# Patient Record
Sex: Female | Born: 1935 | Race: White | Hispanic: No | Marital: Single | Smoking: Former smoker
Health system: Southern US, Community
[De-identification: ages and names within clinical notes are randomized; demographics above are authoritative.]

## PROBLEM LIST (undated history)

## (undated) DIAGNOSIS — M545 Low back pain, unspecified: Secondary | ICD-10-CM

## (undated) DIAGNOSIS — N183 Chronic kidney disease, stage 3 unspecified: Secondary | ICD-10-CM

## (undated) DIAGNOSIS — I509 Heart failure, unspecified: Secondary | ICD-10-CM

## (undated) DIAGNOSIS — E079 Disorder of thyroid, unspecified: Secondary | ICD-10-CM

## (undated) DIAGNOSIS — F1011 Alcohol abuse, in remission: Secondary | ICD-10-CM

## (undated) DIAGNOSIS — R1310 Dysphagia, unspecified: Secondary | ICD-10-CM

## (undated) DIAGNOSIS — K56609 Unspecified intestinal obstruction, unspecified as to partial versus complete obstruction: Secondary | ICD-10-CM

## (undated) DIAGNOSIS — I10 Essential (primary) hypertension: Secondary | ICD-10-CM

## (undated) DIAGNOSIS — M6281 Muscle weakness (generalized): Secondary | ICD-10-CM

## (undated) DIAGNOSIS — E039 Hypothyroidism, unspecified: Secondary | ICD-10-CM

## (undated) DIAGNOSIS — F1121 Opioid dependence, in remission: Secondary | ICD-10-CM

## (undated) DIAGNOSIS — G8929 Other chronic pain: Secondary | ICD-10-CM

## (undated) DIAGNOSIS — I4891 Unspecified atrial fibrillation: Secondary | ICD-10-CM

## (undated) DIAGNOSIS — W19XXXA Unspecified fall, initial encounter: Secondary | ICD-10-CM

## (undated) HISTORY — PX: ABDOMINAL HYSTERECTOMY: SHX81

## (undated) HISTORY — PX: BACK SURGERY: SHX140

## (undated) HISTORY — PX: ROTATOR CUFF REPAIR: SHX139

---

## 1998-02-13 ENCOUNTER — Other Ambulatory Visit: Admission: RE | Admit: 1998-02-13 | Discharge: 1998-02-13 | Payer: Self-pay

## 1998-02-14 ENCOUNTER — Other Ambulatory Visit: Admission: RE | Admit: 1998-02-14 | Discharge: 1998-02-14 | Payer: Self-pay

## 1998-02-19 ENCOUNTER — Other Ambulatory Visit: Admission: RE | Admit: 1998-02-19 | Discharge: 1998-02-19 | Payer: Self-pay

## 2004-04-17 ENCOUNTER — Emergency Department (HOSPITAL_COMMUNITY): Admission: EM | Admit: 2004-04-17 | Discharge: 2004-04-17 | Payer: Self-pay | Admitting: Emergency Medicine

## 2006-08-18 ENCOUNTER — Encounter (HOSPITAL_BASED_OUTPATIENT_CLINIC_OR_DEPARTMENT_OTHER): Admission: RE | Admit: 2006-08-18 | Discharge: 2006-09-20 | Payer: Self-pay | Admitting: Surgery

## 2006-09-24 ENCOUNTER — Encounter (HOSPITAL_BASED_OUTPATIENT_CLINIC_OR_DEPARTMENT_OTHER): Admission: RE | Admit: 2006-09-24 | Discharge: 2006-12-23 | Payer: Self-pay | Admitting: Surgery

## 2006-10-10 ENCOUNTER — Inpatient Hospital Stay (HOSPITAL_COMMUNITY): Admission: EM | Admit: 2006-10-10 | Discharge: 2006-10-14 | Payer: Self-pay | Admitting: Emergency Medicine

## 2007-07-12 ENCOUNTER — Ambulatory Visit (HOSPITAL_COMMUNITY): Admission: RE | Admit: 2007-07-12 | Discharge: 2007-07-12 | Payer: Self-pay | Admitting: Family Medicine

## 2008-07-30 ENCOUNTER — Ambulatory Visit (HOSPITAL_COMMUNITY): Admission: RE | Admit: 2008-07-30 | Discharge: 2008-07-30 | Payer: Self-pay | Admitting: Family Medicine

## 2008-08-14 ENCOUNTER — Ambulatory Visit (HOSPITAL_BASED_OUTPATIENT_CLINIC_OR_DEPARTMENT_OTHER): Admission: RE | Admit: 2008-08-14 | Discharge: 2008-08-14 | Payer: Self-pay | Admitting: Urology

## 2009-04-01 ENCOUNTER — Encounter (HOSPITAL_BASED_OUTPATIENT_CLINIC_OR_DEPARTMENT_OTHER): Admission: RE | Admit: 2009-04-01 | Discharge: 2009-04-23 | Payer: Self-pay | Admitting: General Surgery

## 2009-07-09 ENCOUNTER — Encounter: Admission: RE | Admit: 2009-07-09 | Discharge: 2009-07-09 | Payer: Self-pay | Admitting: Orthopedic Surgery

## 2009-07-31 ENCOUNTER — Ambulatory Visit (HOSPITAL_COMMUNITY): Admission: RE | Admit: 2009-07-31 | Discharge: 2009-07-31 | Payer: Self-pay | Admitting: Family Medicine

## 2009-11-07 ENCOUNTER — Emergency Department (HOSPITAL_COMMUNITY): Admission: EM | Admit: 2009-11-07 | Discharge: 2009-11-07 | Payer: Self-pay | Admitting: Emergency Medicine

## 2010-09-23 ENCOUNTER — Ambulatory Visit (HOSPITAL_COMMUNITY)
Admission: RE | Admit: 2010-09-23 | Discharge: 2010-09-23 | Payer: Self-pay | Source: Home / Self Care | Attending: Family Medicine | Admitting: Family Medicine

## 2010-09-28 ENCOUNTER — Inpatient Hospital Stay (HOSPITAL_COMMUNITY)
Admission: EM | Admit: 2010-09-28 | Discharge: 2010-09-30 | Payer: Self-pay | Source: Home / Self Care | Attending: Internal Medicine | Admitting: Internal Medicine

## 2010-10-06 LAB — COMPREHENSIVE METABOLIC PANEL
ALT: 14 U/L (ref 0–35)
AST: 22 U/L (ref 0–37)
Albumin: 2.6 g/dL — ABNORMAL LOW (ref 3.5–5.2)
Alkaline Phosphatase: 49 U/L (ref 39–117)
BUN: 21 mg/dL (ref 6–23)
CO2: 28 mEq/L (ref 19–32)
Calcium: 8.4 mg/dL (ref 8.4–10.5)
Chloride: 104 mEq/L (ref 96–112)
Creatinine, Ser: 1.31 mg/dL — ABNORMAL HIGH (ref 0.4–1.2)
GFR calc Af Amer: 47 mL/min — ABNORMAL LOW (ref >60)
GFR calc non Af Amer: 39 mL/min — ABNORMAL LOW (ref >60)
Glucose, Bld: 95 mg/dL (ref 70–99)
Potassium: 4.3 mEq/L (ref 3.5–5.1)
Sodium: 139 mEq/L (ref 135–145)
Total Bilirubin: 0.4 mg/dL (ref 0.3–1.2)
Total Protein: 5.3 g/dL — ABNORMAL LOW (ref 6.0–8.3)

## 2010-10-06 LAB — URINE MICROSCOPIC-ADD ON

## 2010-10-06 LAB — CBC
HCT: 33.4 % — ABNORMAL LOW (ref 36.0–46.0)
HCT: 31.7 % — ABNORMAL LOW (ref 36.0–46.0)
Hemoglobin: 10.9 g/dL — ABNORMAL LOW (ref 12.0–15.0)
Hemoglobin: 10.3 g/dL — ABNORMAL LOW (ref 12.0–15.0)
MCH: 31.6 pg (ref 26.0–34.0)
MCH: 32 pg (ref 26.0–34.0)
MCHC: 32.6 g/dL (ref 30.0–36.0)
MCHC: 32.5 g/dL (ref 30.0–36.0)
MCV: 96.8 fL (ref 78.0–100.0)
MCV: 98.4 fL (ref 78.0–100.0)
Platelets: 192 10*3/uL (ref 150–400)
Platelets: 173 10*3/uL (ref 150–400)
RBC: 3.45 MIL/uL — ABNORMAL LOW (ref 3.87–5.11)
RBC: 3.22 MIL/uL — ABNORMAL LOW (ref 3.87–5.11)
RDW: 13.5 % (ref 11.5–15.5)
RDW: 13.9 % (ref 11.5–15.5)
WBC: 10.7 10*3/uL — ABNORMAL HIGH (ref 4.0–10.5)
WBC: 8.5 10*3/uL (ref 4.0–10.5)

## 2010-10-06 LAB — URINALYSIS, ROUTINE W REFLEX MICROSCOPIC
Bilirubin Urine: NEGATIVE
Ketones, ur: NEGATIVE mg/dL
Nitrite: POSITIVE — AB
Protein, ur: NEGATIVE mg/dL
Specific Gravity, Urine: 1.019 (ref 1.005–1.030)
Urine Glucose, Fasting: NEGATIVE mg/dL
Urobilinogen, UA: 0.2 mg/dL (ref 0.0–1.0)
pH: 6.5 (ref 5.0–8.0)

## 2010-10-06 LAB — DIFFERENTIAL
Basophils Absolute: 0.1 10*3/uL (ref 0.0–0.1)
Basophils Relative: 1 % (ref 0–1)
Eosinophils Absolute: 0.1 10*3/uL (ref 0.0–0.7)
Eosinophils Relative: 1 % (ref 0–5)
Lymphocytes Relative: 19 % (ref 12–46)
Lymphs Abs: 2 10*3/uL (ref 0.7–4.0)
Monocytes Absolute: 0.8 10*3/uL (ref 0.1–1.0)
Monocytes Relative: 8 % (ref 3–12)
Neutro Abs: 7.8 10*3/uL — ABNORMAL HIGH (ref 1.7–7.7)
Neutrophils Relative %: 72 % (ref 43–77)

## 2010-10-06 LAB — TSH: TSH: 0.426 u[IU]/mL (ref 0.350–4.500)

## 2010-10-06 LAB — POCT CARDIAC MARKERS
CKMB, poc: <1 ng/mL — ABNORMAL LOW (ref 1.0–8.0)
Myoglobin, poc: 78.7 ng/mL (ref 12–200)
Troponin i, poc: <0.05 ng/mL (ref 0.00–0.09)

## 2010-10-06 LAB — URINE CULTURE
Colony Count: 80000
Culture  Setup Time: 201201082347

## 2010-10-06 LAB — BASIC METABOLIC PANEL
BUN: 29 mg/dL — ABNORMAL HIGH (ref 6–23)
CO2: 28 mEq/L (ref 19–32)
Calcium: 8.7 mg/dL (ref 8.4–10.5)
Chloride: 101 mEq/L (ref 96–112)
Creatinine, Ser: 1.53 mg/dL — ABNORMAL HIGH (ref 0.4–1.2)
GFR calc Af Amer: 39 mL/min — ABNORMAL LOW (ref >60)
GFR calc non Af Amer: 32 mL/min — ABNORMAL LOW (ref >60)
Glucose, Bld: 121 mg/dL — ABNORMAL HIGH (ref 70–99)
Potassium: 4.8 mEq/L (ref 3.5–5.1)
Sodium: 136 mEq/L (ref 135–145)

## 2010-10-06 LAB — PROTIME-INR
INR: 1.19 (ref 0.00–1.49)
Prothrombin Time: 15.3 seconds — ABNORMAL HIGH (ref 11.6–15.2)

## 2010-10-06 LAB — APTT: aPTT: 29 seconds (ref 24–37)

## 2010-10-06 LAB — GLUCOSE, CAPILLARY: Glucose-Capillary: 119 mg/dL — ABNORMAL HIGH (ref 70–99)

## 2010-10-12 ENCOUNTER — Encounter: Payer: Self-pay | Admitting: Family Medicine

## 2010-11-03 ENCOUNTER — Ambulatory Visit
Admission: RE | Admit: 2010-11-03 | Discharge: 2010-11-03 | Disposition: A | Discharge status: Home / Self Care | Payer: Medicare Other | Source: Ambulatory Visit | Attending: Family Medicine | Admitting: Family Medicine

## 2010-11-03 ENCOUNTER — Other Ambulatory Visit: Payer: Self-pay | Admitting: Family Medicine

## 2010-11-03 DIAGNOSIS — L539 Erythematous condition, unspecified: Secondary | ICD-10-CM

## 2010-11-03 DIAGNOSIS — R609 Edema, unspecified: Secondary | ICD-10-CM

## 2010-12-10 LAB — URINALYSIS, ROUTINE W REFLEX MICROSCOPIC
Bilirubin Urine: NEGATIVE
Glucose, UA: NEGATIVE mg/dL
Hgb urine dipstick: NEGATIVE
Ketones, ur: NEGATIVE mg/dL
Nitrite: NEGATIVE
Protein, ur: NEGATIVE mg/dL
Specific Gravity, Urine: 1.02 (ref 1.005–1.030)
Urobilinogen, UA: 0.2 mg/dL (ref 0.0–1.0)
pH: 5.5 (ref 5.0–8.0)

## 2010-12-10 LAB — URINE CULTURE
Colony Count: NO GROWTH
Culture: NO GROWTH

## 2010-12-10 LAB — OVA AND PARASITE EXAMINATION: Ova and parasites: NONE SEEN

## 2010-12-10 LAB — CBC
HCT: 30.5 % — ABNORMAL LOW (ref 36.0–46.0)
Hemoglobin: 10.6 g/dL — ABNORMAL LOW (ref 12.0–15.0)
MCHC: 34.6 g/dL (ref 30.0–36.0)
MCV: 94.6 fL (ref 78.0–100.0)
Platelets: 185 K/uL (ref 150–400)
RBC: 3.22 MIL/uL — ABNORMAL LOW (ref 3.87–5.11)
RDW: 13.6 % (ref 11.5–15.5)
WBC: 5.8 10*3/uL (ref 4.0–10.5)

## 2010-12-10 LAB — COMPREHENSIVE METABOLIC PANEL
ALT: 14 U/L (ref 0–35)
AST: 24 U/L (ref 0–37)
Alkaline Phosphatase: 52 U/L (ref 39–117)
CO2: 23 mEq/L (ref 19–32)
Calcium: 9.1 mg/dL (ref 8.4–10.5)
Chloride: 104 mEq/L (ref 96–112)
GFR calc Af Amer: 29 mL/min — ABNORMAL LOW (ref >60)
GFR calc non Af Amer: 24 mL/min — ABNORMAL LOW (ref >60)
Glucose, Bld: 99 mg/dL (ref 70–99)
Potassium: 5 mEq/L (ref 3.5–5.1)
Sodium: 135 mEq/L (ref 135–145)

## 2010-12-10 LAB — COMPREHENSIVE METABOLIC PANEL WITH GFR
Albumin: 4 g/dL (ref 3.5–5.2)
BUN: 38 mg/dL — ABNORMAL HIGH (ref 6–23)
Creatinine, Ser: 2.01 mg/dL — ABNORMAL HIGH (ref 0.4–1.2)
Total Bilirubin: 0.8 mg/dL (ref 0.3–1.2)
Total Protein: 7 g/dL (ref 6.0–8.3)

## 2010-12-10 LAB — DIFFERENTIAL
Basophils Absolute: 0 K/uL (ref 0.0–0.1)
Basophils Relative: 0 % (ref 0–1)
Eosinophils Absolute: 0.1 10*3/uL (ref 0.0–0.7)
Eosinophils Relative: 2 % (ref 0–5)
Lymphocytes Relative: 25 % (ref 12–46)
Lymphs Abs: 1.4 10*3/uL (ref 0.7–4.0)
Monocytes Absolute: 0.7 K/uL (ref 0.1–1.0)
Monocytes Relative: 12 % (ref 3–12)
Neutro Abs: 3.4 K/uL (ref 1.7–7.7)
Neutrophils Relative %: 60 % (ref 43–77)

## 2010-12-10 LAB — CLOSTRIDIUM DIFFICILE EIA: C difficile Toxins A+B, EIA: NEGATIVE

## 2010-12-10 LAB — STOOL CULTURE

## 2010-12-10 LAB — URINE MICROSCOPIC-ADD ON

## 2010-12-10 LAB — HEMOCCULT GUIAC POC 1CARD (OFFICE): Fecal Occult Bld: NEGATIVE

## 2011-02-03 NOTE — Op Note (Signed)
[name], [name]               ACCOUNT NO.:  [id number]   MEDICAL RECORD NO.:  [id number]          PATIENT TYPE:  AMB   LOCATION:  [facility]                         FACILITY:  [facility]   PHYSICIAN:  Martina Sinner, MD DATE OF BIRTH:  [birth year]   DATE OF PROCEDURE:  DATE OF DISCHARGE:                               OPERATIVE REPORT   PREOPERATIVE DIAGNOSES:  1. Neurogenic bladder.  2. Muscle spasm.  3. Refractory urge incontinence.   POSTOPERATIVE DIAGNOSES:  1. Neurogenic bladder.  2. Muscle spasm.  3. Refractory urge incontinence.   SURGERY:  1. Cystoscopy.  2. Hydrodistention.  3. Injection of Botox.   Christina [name] has refractory urge incontinence.  She was refractory to  medication.  Her neurogenic bladder was elected to be treated with  Botox.  The patient was prepped and draped in the usual fashion.  She  was given preoperative antibiotics.  The [facility] microscope was utilized.  She had grade 2-4 bladder trabeculation, which for a woman was  impressive.  Otherwise, her bladder mucosa and trigone were normal.  She  is hydrodistended at 600 ml.  She has no glomerulations on a second  look.  I injected 200 units of Botox in 20 cc of saline using the steady  template.  There was no bleeding.  Bladder was empty.  The patient was  taken to the recovery room.   Hopefully, this procedure will reach her treatment goal.  She will be  kept on trimethoprim prophylaxis.           ______________________________  Martina Sinner, MD  Electronically Signed     SAM/MEDQ  D:  08/14/2008  T:  08/14/2008  Job:  [id number]

## 2011-02-03 NOTE — Assessment & Plan Note (Signed)
[facility]   [name], [name]               ACCOUNT NO.:  [id number]   MEDICAL RECORD NO.:  [id number]      DATE OF BIRTH:  1924/11/26   PHYSICIAN:  Leonie Man, M.D.    VISIT DATE:  04/02/2009                                   OFFICE VISIT   PROBLEM:  Partial-thickness wound of the right forearm.   HISTORY OF PRESENT ILLNESS:  [name] is an 77 year old lady with a  history of partial thickness right forearm wounds, sustained in a fall.  This was apparently a larger wound with a partial-thickness skin flap,  which was replaced over the wound by her primary care doctor, thereby  covering most of the deficits.  This is healed with excellent results  except for an area of about 2 x 0.8 x 0.1 cm, which has remained  nonhealing.  The patient had continued self-treatments to date with  compression and with topical gel that have not been further  characterized by her.  She is here today self-referred for evaluation  and treatment.   ALLERGIES:  LYRICA and SUDAFED, both of which cause rashes.   SURGERY ILLNESSES AND CONDITIONS:  1. Total abdominal hysterectomy.  2. Rotator cuff repair.  3. Appendectomy.  4. Breast biopsies x2 for benign breast disease.  5. Split-thickness skin graft for skin cancer.  6. Lumbar diskectomy for an L4, L5, S1.  Following this, the patient      has developed and continues to have chronic low back pain syndrome.  7. Exploratory laparotomy for small bowel obstruction.  8. Hypertension.   CURRENT MEDICATIONS:  1. Synthroid 0.75 mcg daily.  2. Diovan 160 mg daily.  3. Wellbutrin XL 300 mg daily.  4. Aricept 10 mg daily.  5. Namenda 10 mg daily.  6. Trazodone 50 mg daily.  7. Melatonin 3 mg nightly.  8. Ocuvite 1 mg p.r.n.  9. Magnesium 250 mg daily.  10.Potassium 595 mg daily.  11.Glucosamine 595 mg daily.  12.Vitamin B 1 daily.  13.Vitamin D 1 daily.  14.Aleve p.r.n.  15.Lidoderm patch.   SOCIAL HISTORY:  Single  white female who speaks English only.  No  tobacco, alcohol, or recreational drug use history.  Her religious  preference is [employer]   REVIEW OF SYSTEMS:  CARDIOVASCULAR:  Hypertension.  PULMONARY:  Negative.  ENDOCRINE:  Denies diabetes.  GASTROINTESTINAL:  Negative.  GENITOURINARY:  Negative.  MUSCULOSKELETAL:  Low back pain syndrome.  NEUROPSYCHIATRIC:  Negative.   PHYSICAL EXAMINATION:  VITAL SIGNS:  Temperature 98.6, pulse 90,  respirations 16, blood pressure 145/68.  NECK:  No thyromegaly.  No adenopathy.  No carotid bruits with normal  carotid upstrokes.  LUNGS:  Clear to auscultation bilaterally.  HEART:  Regular rate and rhythm.  No murmurs.  ABDOMEN:  Soft, nontender, nondistended, normoactive bowel sounds  without masses or visceromegaly.  EXTREMITIES:  She has a right arm partial-thickness wound with  epithelialization and no evidence of erythema or infection.  This  patient has extremely thin skin and very easily bruisable.   ASSESSMENT:  Healing partial-thickness wound of the right arm.   TREATMENT:  Today, hydrogel, Allevyn pad, and a dry sterile dressing.  Follow up in 2 weeks.      Christina [name]  Christina [name], M.D.  Electronically Signed     PB/MEDQ  D:  04/02/2009  T:  04/03/2009  Job:  [id number]   cc:   Gloriajean Dell. Andrey Campanile, M.D.

## 2011-02-03 NOTE — Assessment & Plan Note (Signed)
[facility]   NAMEREATA, Christina [name]               ACCOUNT NO.:  [id number]   MEDICAL RECORD NO.:  [id number]      DATE OF BIRTH:  08-31-1925   PHYSICIAN:  Leonie Man, M.D.    VISIT DATE:  04/15/2009                                   OFFICE VISIT   PROBLEM:  Partial-thickness wound, right forearm.   [name] returns today for evaluation of a right forearm wound which was  obtained through a fall and repaired with replacement of a flap incision  of this partial-thickness wound.  There is a segment of the wound that  was nonhealing.  When she was last seen, she was treated with hydrogel  and Allevyn pad.  She returns today for followup.   The patient is afebrile with stable vital signs.  The wound of the left  forearm is now completely healed.  The patient is reassured that the  wound is healing and that she should continue to moisturize her skin in  all places and particularly over the region of the wound.  She has been  using some hydrocortisone to stop itching over this area.  I have asked  her to stop using the hydrocortisone given that her skin is so extremely  thin but to simply use a moisturizer.  We will follow up with her p.r.n.      Leonie Man, M.D.  Electronically Signed     PB/MEDQ  D:  04/15/2009  T:  04/16/2009  Job:  [id number]

## 2011-02-06 NOTE — Assessment & Plan Note (Signed)
[facility]   NAME:  Christina [name], Christina [name]                   ACCOUNT NO.:  [id number]   MEDICAL RECORD NO.:  [id number]      DATE OF BIRTH:  July 05, 1925   PHYSICIAN:  Theresia Majors. Tanda Rockers, M.D. VISIT DATE:  09/27/2006                                   OFFICE VISIT   VITAL SIGNS:  Blood pressure is 159/56, respirations are 20, pulse rate  74, temperature is 97.1.   PURPOSE OF TODAY'S VISIT:  [name] is a 77 year old lady whom we  follow for traumatic stasis ulcer.  We have treated her with a  compression wrap.  She returns.  She denies excessive drainage, pain,  malodor or fever.   WOUND EXAM:  Inspection of the right lower extremity shows that the  wound is completely resolved.   WOUND SINCE LAST VISIT:   CHANGE IN INTERVAL MEDICAL HISTORY:   DIAGNOSIS:  Resolved wound.   TREATMENT:   ANESTHETIC USED:   TISSUE DEBRIDED:   LEVEL:   CHANGE IN MEDS:   COMPRESSION BANDAGE:   OTHER:   MANAGEMENT PLAN & GOAL:  We have discharged the patient.  We have given  her a prescription to procure bilateral 30-40 mm below-the-knee  compression hose.  We have advised her that wearing a compression hose  in the immediate period will prevent breakdown of the fragile  epithelium.  Patient was given the opportunity to ask questions.  She  seems to understand this approach.  She expresses gratitude for having  been seen in the clinic and indicates that she will be compliant.  We  are discharging her.  We will follow up on a p.r.n. basis.           ______________________________  Theresia Majors Tanda Rockers, M.D.    Cephus Slater  D:  09/27/2006  T:  09/28/2006  Job:  [id number]

## 2011-02-06 NOTE — Consult Note (Signed)
[name], [name]                   ACCOUNT NO.:  [id number]   MEDICAL RECORD NO.:  [id number]          PATIENT TYPE:  REC   LOCATION:  FOOT                         FACILITY:  [facility]   PHYSICIAN:  Theresia Majors. Tanda Rockers, M.D.DATE OF BIRTH:  30-Mar-1925   DATE OF CONSULTATION:  DATE OF DISCHARGE:                                 CONSULTATION   REASON FOR CONSULTATION:  This 77 year old female was referred by Dr.  Doristine Counter and Rema Fendt, family nurse practitioner, from [facility] for evaluation of a nonhealing ulcer on the anterior  right lower extremity.   IMPRESSION:  A posttraumatic stasis ulcer.   PLAN:  Proceed with an Unna boot protocol with serial debridements and a  silver matrix dressing.  Reevaluate the patient at weekly intervals  p.r.n.   SUBJECTIVE:  Christina [name] is a 77 year old female who sustained a laceration  on the anterolateral right lower extremity approximately a month ago.  She had delayed treatment and was subsequently seen by Rema Fendt,  the family nurse practitioner, and was cultured, prophylaxed with  tetanus vaccine and pneumococcal vaccine.  Subsequently, the wound was  treated with antibiotics but there was no appreciable improvement.  She  subsequently was started on doxycycline, reevaluated with increased  hyperemia, no malodor, and no fever.  The patient was, thereafter,  referred to the wound center.   PAST MEDICAL HISTORY:  Is remarkable for no known drug allergies.  Her  previous surgery has included a vein ligation of the right lower  extremity in 2002.  She had a laminectomy at L3 and S1 in 1999,  complicated by MRSA infection.  She had a rotator cuff repair in 1994.  She has been told that she has poor circulation.   CURRENT MEDICATIONS:  Include:  1. Synthroid 0.1 mg daily.  2. Ultram-XL b.i.d.  3. Wellbutrin 100 mg daily.  4. Aricept 10 mg q.h.s.  5. Vesicare 500 mg q.h.s.  6. Ocuvite daily vitamins.  7. Desyrel 25 to  50 mg q.h.s.   FAMILY HISTORY:  Is positive for cancer, diabetes, and stroke.  Is  negative for myocardial infarction.   SOCIAL HISTORY:  She is married, she has 4 children, 1 who lives  locally, 3 live remotely.  She is a retired Engineer, civil (consulting).   REVIEW OF SYSTEMS:  Is remarkable for decreased exercise tolerance due  to pain from her low back and into her legs.  She is a non-smoker,  reported over 2 decades ago.  She denies heat or cold intolerance or  recent weight gain or loss.  She denies bowel or bladder dysfunction.  She specifically denies angina pectoris, dyspnea, shortness of breath,  or a productive cough.  The remainder of the review of systems is  negative.   PHYSICAL EXAM:  GENERAL:  She is an alert, oriented female in no acute  distress.  VITALS:  She is 5 feet 3 inches.  Weighs 168 pounds.  Blood pressure is  130/72, respirations 18, pulse rate 78.  HEENT EXAM:  Clear.  NECK:  Supple.  Trachea is midline,  thyroid is nonpalpable.  LUNGS:  Clear.  HEART:  Sounds are normal.  ABDOMEN:  Soft without aneurysmal dilatation.  EXTREMITIES:  The femoral pulses are 3+.  The dorsalis pedis pulses are  3+.  There is trace edema on the left.  There is 3+ edema on the right  associated with chronic changes of stasis, including prominent ropey  varicosities from the medial calf distally, from the reticular veins on  the dorsum of the foot.  On the lateral anterior shin of the right lower  extremity there is a chronic-appearing ulceration with desquamated dry  skin at the periphery, a halo of hyperpigmentation.  This wound is  moderately tender to palpation.  There is no excessive drainage, and  there is no particular malodor.  The left lower extremity is essentially  normal.  Neurologically, protective sensation is preserved.   DISCUSSION:  Christina [name] gives a history of having sustained trauma  approximately a month ago.  The resultant wound has now been converted  into a frank stasis  ulcer associated with fluid retention, edema, and  chronic inflammation. We will proceed with an Unna boot protocol to  control her edema.  The wound has been previously cultured.  We will not  restart her antibiotics, but we will initially give her antisepsis  utilizing a silver matrix dressing and compression. We will reevaluate  her in 1 week and reassess the wound at that time.  We have explained  this approach to the patient in terms that she seems to understand.  We  have given her an opportunity to ask questions.  She expresses gratitude  for having been seen in the clinic and indicates that she will be  compliant as per above.           ______________________________  Theresia Majors. Tanda Rockers, M.D.     Cephus Slater  D:  08/19/2006  T:  08/20/2006  Job:  [id number]   cc:   Marjory Lies, M.D.  Dr. Renette Butters

## 2011-02-06 NOTE — Assessment & Plan Note (Signed)
[facility]   NAME:  Christina [name], Christina [name]                   ACCOUNT NO.:  [id number]   MEDICAL RECORD NO.:  [id number]      DATE OF BIRTH:  [birth year]   PHYSICIAN:  Theresia Majors. Tanda Rockers, M.D.      VISIT DATE:                                   OFFICE VISIT   Ms. Lacko returns for a followup of the stasis ulcer of her right lower  extremity.  In the interim, she has worn a compressive wrap.  She denies  progressive pain, malodor or fever.   OBJECTIVE:  Blood pressure is 132/80, respirations 16, pulse rate 67,  temperature is 98 degrees.  Inspection of the wound shows a very has been significant contraction  with 100% granulation and no necrosis.  A debridement is not needed.   IMPRESSION:  Improved stasis ulcer.   PLAN:  Follow the patient up in 1 week.           ______________________________  Theresia Majors. Tanda Rockers, M.D.     Cephus Slater  D:  09/09/2006  T:  09/10/2006  Job:  [id number]

## 2011-02-06 NOTE — Discharge Summary (Signed)
NAMESIRIAH, TREAT               ACCOUNT NO.:  [id number]   MEDICAL RECORD NO.:  [id number]          PATIENT TYPE:  INP   LOCATION:  1618                         FACILITY:  [facility]   PHYSICIAN:  Michaelyn Barter, M.D. DATE OF BIRTH:  [birth year]   DATE OF ADMISSION:  10/10/2006  DATE OF DISCHARGE:                               DISCHARGE SUMMARY   PRIMARY CARE PHYSICIAN:  Unassigned.   FINAL DIAGNOSES:  1. Possible drug reaction to Fentanyl.  2. Pyelonephritis secondary to Escherichia coli.  3. Chronic low back pain.  4. Possible addiction to pain medications.  5. History of alcohol abuse.  6. Probable alcohol withdrawal related symptoms.  7. Hypokalemia.  8. Hyponatremia.  9. Leukocytosis.   PROCEDURES:  1. Cervical spine x-rays completed October 10, 2006.  2. CT scan of the head without contrast completed October 10, 2006.  3. X-rays of the lumbar spine completed October 10, 2006.   HISTORY OF PRESENT ILLNESS:  Christina [name] is a 77 year old female who has  been followed by the pain clinic and Dr. Modesto Charon for chronic pain  management. She had been on Ultram for an extended period of time and  was recently started on Fentanyl patch approximately 5 days prior to  admission. Her daughter indicated that instead of taking one Fentanyl  patch as directed, the patient may have become confused possibly  secondary to alcohol, and or over consumption of her other medications  and she may have used more Fentanyl patches than she was told to use.  The patient indicated that shortly after using the Fentanyl she became  unable to turn herself while in bed and she briefly became unable to  walk. She also stated that shortly after discontinuing the Fentanyl  patch she regained her ability to move as well as her strength began to  return.   PAST MEDICAL HISTORY:  Please see that dictated by Dr. Hannah Beat.   HOSPITAL COURSE:  1. Possible reaction to Fentanyl patch. Again, I had a very lengthy      discussion with the patient's daughter. The patient's daughter      indicated that the patient possibly did use more Fentanyl patches      than she was prescribed. The weakness that the patient suffered      from may have been related to the Fentanyl patch. The Fentanyl      patch was discontinued prior to the patient coming to the hospital.      Over the course of her hospitalization her strength has gradually      returned although she may not be back to her baseline. She      definitely appears to be much stronger than she was when she first      came in to the hospital.  2. Pyelonephritis. A urinalysis confirmed the presence of a urinary      tract infection. Urine culture was reported to be positive for      greater than 100,000 E. coli. The patient has been started on      ciprofloxacin. She has not reported any urinary  related symptoms      over the course of her hospitalization.  3. Chronic low back pain. Patient has had few to no complaints of back      pain throughout the course of this hospitalization. She has been      receiving p.r.n. Oxycodone as well as Ultram. Physical therapy has      been consulted as well as occupational therapy.  4. Hypokalemia. The patient has received potassium supplementation      over the course of her hospitalization. Her potassium level was      noted to be 3.2 on October 11, 2006. As of January 22nd it is up to      4.1.  5. History of alcohol abuse. The patient's daughter vocalized some      concerns that her mother may have been addicted to alcohol. She      stated that she has tendency to take multiple pain medications and      to wash them down with several shots of alcohol. On August 10, 2006 the patient was noted to have become tachycardic, developed      fever as high as 102.9, and to complain of feeling tremulous. There      was concern that the patient may have been demonstrating signs of      early alcohol withdrawal,  therefore the Librium protocol was      initiated. Since the 20th the patient has not had any documented      reoccurrences of these sympathetic driven symptoms. She appears to      be much calmer over the past 24 to 48 hours.  6. Hyponatremia. This has resolved over the course of the      hospitalization.  7. Weakness. Again, physical therapy has been consulted. This appears      to be acute on chronic; the Fentanyl may have played a role with      regards to the acute component. However the daughter stated that      the patient has a baseline whereby she somewhat lacks motivation to      do much of anything. Therefore physical therapy should continue. It      appears that the patient may be approaching her baseline and with      that, may be approaching discharge.  Psychiatry was consulted, Dr. Jeanie Sewer did see the patient on October 11, 2006. His assessment was that the patient did have a mood disorder  not otherwise specified, as well as alcohol dependence and anxiety  disorder. He gave several recommendations with regards to the patient's  medications.  The patient is being prepared for transfer to a subacute nursing  facility.   At the time of her discharge her medications will consist of the  following:  1. Wellbutrin 100 mg p.o. q.8 hours.  2. Enablex 7.5 mg p.o. daily.  3. Aricept 10 mg p.o. q.h.s.  4. Levothyroxine 100 mcg p.o. daily.  5. Metoprolol 25 mg p.o. b.i.d.  6. Multivitamin one daily.  7. Senokot-S one p.o. q.h.s.  8. Thiamine 100 mg p.o. daily.  9. Trazodone 25 mg p.o. q.h.s.  10.Ultram 25 to 50 mg p.o. q.6 hours p.r.n.  11.Oxycodone 5 mg p.o. q.4 hours p.r.n.      Michaelyn Barter, M.D.  Electronically Signed     OR/MEDQ  D:  10/12/2006  T:  10/12/2006  Job:  [id number]

## 2011-02-06 NOTE — Assessment & Plan Note (Signed)
[facility]   NAME:  Christina [name], Christina [name]                   ACCOUNT NO.:  [id number]   MEDICAL RECORD NO.:  [id number]      DATE OF BIRTH:  1924-12-03   PHYSICIAN:  Theresia Majors. Tanda Rockers, M.D.      VISIT DATE:                                   OFFICE VISIT   VITAL SIGNS:  Blood pressure is 132/82, respirations 16, pulse rate 72,  and she is afebrile.   PURPOSE OF TODAY'S VISIT:  [name] returns for followup of the stasis  ulcer on the right anterior shin of the lower extremity.  In the  interim, she denies excessive drainage, malodor, pain, or fever.   WOUND EXAM:  Inspection of the lower extremities shows that the  compression wrap has been affected.  The volume is decreased  significantly.  There is 100% granulation with advancement of epithelium  from the circumference.   DIAGNOSIS:  Improved wound.   MANAGEMENT PLAN & GOAL:  We will return the patient to an Unna wrap with  UltraMide cream.  We will reevaluate her in 1 week.           ______________________________  Theresia Majors. Tanda Rockers, M.D.     Cephus Slater  D:  09/16/2006  T:  09/17/2006  Job:  [id number]

## 2011-02-06 NOTE — Assessment & Plan Note (Signed)
[facility]   NAME:  Christina [name], Christina [name]                   ACCOUNT NO.:  [id number]   MEDICAL RECORD NO.:  [id number]      DATE OF BIRTH:  Nov 13, 1924   PHYSICIAN:  Theresia Majors. Tanda Rockers, M.D.      VISIT DATE:                                   OFFICE VISIT   VITAL SIGNS:  Blood pressure 136/84, respirations 18, pulse rate 72, and  she is afebrile.   PURPOSE OF TODAY'S VISIT:  Christina [name] is 77 year old lady whom we have  seen for a stasis ulcer on the right anterior lower extremity.  In the  interim, she has had a fall and sustained an ecchymotic bruise over the  right eye.  She was not seen by a physician but denies syncope and  denies visual changes.  With regard to the ulceration of the lower  extremity, she denies pain, fever, or excessive drainage.   WOUND EXAM:  Inspection of the wound shows that it has 100% granulation  with scant exudate, no malodor, and minimum erythema.   DIAGNOSIS:  Improved stasis ulcer.   MANAGEMENT PLAN & GOAL:  We will resume her Unna boot wrap with a soft  sole and reevaluate her in 1 week.           ______________________________  Theresia Majors. Tanda Rockers, M.D.     Christina [name]  D:  09/02/2006  T:  09/03/2006  Job:  [id number]

## 2011-02-06 NOTE — Assessment & Plan Note (Signed)
[facility]   NAME:  Christina [name], Christina [name]                   ACCOUNT NO.:  [id number]   MEDICAL RECORD NO.:  [id number]      DATE OF BIRTH:  Feb 20, 1925   PHYSICIAN:  Theresia Majors. Tanda Rockers, M.D. VISIT DATE:  08/26/2006                                   OFFICE VISIT   VITAL SIGNS:  Blood pressure 124/82, respirations 16, pulse rate 72 and  she is afebrile.   PURPOSE OF TODAY'S VISIT:  Christina [name] returns for followup of a stasis  ulcer on the anterior surface of the right lower extremity.  In the  interim she has been treated with a Prisma dressing, Hydrogel and an  Unna wrap.  She reports that there has been minimal pain.  No malodor  and minimal drainage.   WOUND EXAM:  Inspection of the wound shows that there has been adequate  compression, manifested by linear wrinkles of the leg.  The ulcer itself  is clean.  There has been some contraction but this is mostly related to  the decrease in the edema.  There appears to be healthy appearing  granulation.  There are small areas of necrosis which did not need  debridement to date.   DIAGNOSIS:  Improved stasis ulcer.   MANAGEMENT PLAN & GOAL:  We will resume Hydrogel, Prisma and Unna boot  and will reevaluate her in one week.           ______________________________  Theresia Majors. Tanda Rockers, M.D.     Christina [name]  D:  08/26/2006  T:  08/27/2006  Job:  [id number]

## 2011-02-06 NOTE — Consult Note (Signed)
Christina [name], Christina [name]               ACCOUNT NO.:  [id number]   MEDICAL RECORD NO.:  [id number]          PATIENT TYPE:  INP   LOCATION:  1618                         FACILITY:  [facility]   PHYSICIAN:  Antonietta Breach, M.D.  DATE OF BIRTH:  07-12-1925   DATE OF CONSULTATION:  10/11/2006  DATE OF DISCHARGE:                                 CONSULTATION   REASON FOR CONSULTATION:  Excessive narcotic use combined with daily  alcohol depression and anxiety.   REQUESTING PHYSICIAN:  Dr. Michaelyn Barter.   HISTORY OF PRESENT ILLNESS:  Christina [name] is a 77 year old female  admitted to the [facility] on October 10, 2006 due  to an adverse reaction to a fentanyl patch.   The patient suffers chronic pain from degenerative disk disease.  She is  followed by the pain clinic and Dr. Modesto Charon for chronic pain management.  She also has been on a fentanyl patch and has been drinking a glass of  wine at least every day. The patient developed stupor and inability to  turn in the bed and an inability to walk by the time she presented to  the emergency room. She was evaluated to have a pyelonephritis with a  fever.   The patient also describes a continued set of depression symptoms.  She  has been on Wellbutrin 100 mg daily and she has dropped her trazodone  down from 300 mg in the evening to only 12.5 q.h.s. She has several days  of decreased energy down to a 5/10.  She has been self-medicating with  alcohol to help her anxiety.  She also has trouble concentrating and  feeling on edge.   She has no thoughts of harming herself, no thoughts of harming others.  She has no hallucinations or delusions.   PAST PSYCHIATRIC HISTORY:  The patient first developed depression in  1998/02/03 when her fiance died. She had back surgery that same year.  She was  started on Wellbutrin. She was also started on trazodone at that bedtime  which was increased to 300 mg q.h.s. She responded initially to  the  treatment.   She has no history of mania, no history of hallucinations or delusions.  No history of suicide attempts.   FAMILY PSYCHIATRIC HISTORY:  She has two brothers with alcoholism.   SOCIAL HISTORY:  The patient is single.  She has four children.  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 Education Charity fundraiser. No illegal drugs.  She lives by herself. She has recently moved in behind one of her  daughter's house and this has increased her hope and her local support.   GENERAL MEDICAL PROBLEMS:  Please see above.  The patient has a history  of laminectomy on her back in 02/03/1998 which was complicated by an MRSA  infection. She has a history of a traumatic stasis ulcer, rotator cuff  repair in Feb 03, 1993, history of hypothyroidism, chronic right lower extremity  weakness due to the above.   MEDICATIONS:  The MAR is reviewed. The patient's psychotropics include  Wellbutrin 100 mg q.a.m., trazodone 12.5 mg q.h.s. She is  on Aricept 10  mg q.h.s., Librium 25 mg q.6 h p.r.n.   ALLERGIES:  SUDAFED.   LABORATORY DATA:  WBC 7.0, hemoglobin 11.0, platelet count 168.  Metabolic panel, potassium mildly decreased at 3.2, glucose mildly  elevated at 125, BUN 21, creatinine 1.01, calcium 8.1.  TSH was  decreased at 0.065.  Urine drug screen was negative. Alcohol was  negative on admission. The urinalysis showed too many WBC.   Head CT on October 10, 2006 showed no acute intracranial abnormalities.  There was atrophy and chronic small vessel disease.   REVIEW OF SYSTEMS:  CONSTITUTIONAL:  Afebrile.  HEAD:  No trauma.  EYES:  No visual changes.  EARS:  No hearing impairment.  NOSE:  No rhinorrhea.  MOUTH/THROAT:  No sore throat. NEUROLOGIC:  Unremarkable.  There is no  history of seizures. PSYCHIATRIC:  As above.  CARDIOVASCULAR:  No chest  pain, palpitations or edema. RESPIRATORY:  No coughing or wheezing.  GASTROINTESTINAL:  No nausea, vomiting, diarrhea.  GENITOURINARY:  As  above.  SKIN:   Unremarkable.  HEMATOLOGIC/LYMPHATIC:  Mild anemia.  ENDOCRINE/METABOLIC:  Decreased TSH as above.  MUSCULOSKELETAL:  As  above.   PHYSICAL EXAMINATION:  VITAL SIGNS:  Temperature 98.2, pulse 76,  respiration 20, blood pressure 129/70, O2 saturation on room air 97%.   MENTAL STATUS EXAM:  Christina [name] is an elderly female appearing her  chronological age, reclining in a supine position in her hospital bed  with good eye contact and mildly decreased psychomotor tone. She is well-  groomed. She is oriented to all spheres except she misses the day of the  week by one. She has intact memory to immediate, recent and remote.  Her  fund of knowledge and intelligence are greater than average.  Her speech  involves normal rate and prosody.  Her affect is mildly anxious.  Her  mood is mildly anxious.  Thought process logical, coherent, goal-  directed, no looseness of associations.  Thought content, no thoughts of  harming herself, no thoughts of harming others, no delusions, no  hallucinations.  Judgment is intact.  Insight is partial. She has no  dysarthria.   ASSESSMENT:  AXIS I:  1. 293.83 mood disorder not otherwise specified, depressed (functional      and general medical factors).  2. 293.84 anxiety disorder not otherwise specified.  3. Rule out alcohol dependence.  AXIS II:  None.  AXIS III:  See general medical problems.  AXIS IV:  General medical.  AXIS V:  55.   Christina [name] is not at risk to harm herself or others.  She agrees to use  emergency services immediately for any emergent psychiatric symptoms.   The undersigned provided ego supportive psychotherapy and education.   INFORMED CONSENT:  The indications, alternatives and adverse effects of  Wellbutrin and trazodone were discussed with the patient including the  risk of seizures with Wellbutrin.  She understands the above information  and wants to proceed with these medications.  RECOMMENDATIONS:  1. Would increase the  Wellbutrin to 100 mg SR p.o. b.i.d. at 8 a.m.      and 4 p.m.  2. Would increase the trazodone by 12.5 to 25 mg per day to 100 mg      q.h.s. This should help remove the need for to use alcohol to help      her sleep and provide some antianxiety benefit due to its serotonin      reuptake inhibition. Of note with the noradrenergic  reuptake      inhibition and the trazodone providing serotonin reuptake      inhibition, the patient may have improved pain gating.  3. Would ask the case manager to set this patient up with psychiatric      follow-up at one of the clinics attached to [facility],      [facility] or [facility].  4. The patient declines any AA or hospital based substance relapse      prevention tools; however, if she changes her mind the case manager      can give her a list of a AA meetings and also if she changes her      mind about a hospital base treatment would ask the case manager to      set her up into one of the CDIOPs at [facility], [facility] or      [facility].  5. If the patient tolerates the Wellbutrin increase and residual      depression continue, she can be further tried on 150 mg SR b.i.d.      Also the patient could benefit from cognitive behavioral therapy,      progressive muscle relaxation and deep breathing training which      could be provided at one of the psychiatric clinics as mentioned      above or a counselor of her choice in the community.      Antonietta Breach, M.D.  Electronically Signed     JW/MEDQ  D:  10/12/2006  T:  10/13/2006  Job:  [id number]

## 2011-02-06 NOTE — Discharge Summary (Signed)
Christina [name], MORACE               ACCOUNT NO.:  [id number]   MEDICAL RECORD NO.:  [id number]          PATIENT TYPE:  INP   LOCATION:  1618                         FACILITY:  [facility]   PHYSICIAN:  Mobolaji B. Bakare, M.D.DATE OF BIRTH:  1925/05/27   DATE OF ADMISSION:  10/10/2006  DATE OF DISCHARGE:  10/14/2006                               DISCHARGE SUMMARY   ADDENDUM   PRIMARY CARE PHYSICIAN:  Dr. Doristine Counter.   This is an addendum to an earlier dictation done by Dr. Michaelyn Barter.   DISCHARGE MEDICATIONS:  1. Wellbutrin 100 mg q. 8 a.m. and 4 p.m.  2. Ciprofloxacin 500 mg p.o. b.i.d. to complete treatments on the 26th      of January 2008.  3. Aricept 10 mg q.h.s.  4. Synthroid 100 mcg daily.  5. Multivitamin one tablet daily.  6. Senokot one tablet p.o. q.h.s. Hold for diarrhea.  7. Thiamine 100 mg daily.  8. Trazodone 12.5 mg daily.  9. Ultram 25-50 mg p.o. q.6 h. p.r.n.  10.Oxycodone 5 mg p.o. 4 h. p.r.n.  11.Vesicare 5 mg p.o. daily.   (This has been adjusted compared to previous interim dictation.)   DISPOSITION:  To skilled nursing facility to continue with PT and OT  with anticipation of patient returning home.   HOSPITAL COURSE:  Sequel to previous dictation. The patient was seen  again by psychiatrist, Dr. Jeanie Sewer, regarding major depression and  anxiety with alcohol dependence. Recommendation was to titrate trazodone  by 25 mg q.h.s. to 100 mg q.h.s. and continue Wellbutrin 100 mg b.i.d.  The patient was given information regarding Alcoholics Anonymous, and he  was encouraged to follow through with this.   E. coli UTI. Urine culture drawn on admission came back growing E. coli  greater than 100,000 sensitive to ciprofloxacin. The patient will be  completing five days of ciprofloxacin.   Chronic back pain. She has not received Fentanyl patch since  hospitalization. The back pain is under control with p.r.n. Ultram. We  will continue with p.r.n. Ultram for  mild to moderate pain and oxycodone  for severe pain.   DISCHARGE LABORATORY DATA:  Hemoglobin A1c 5.5. Sodium 135, potassium  4.6, chloride 106, CO2 10, glucose 91, BUN 21, creatinine 1.03.  Bilirubin 0.4, alkaline phosphatase 48. AST 21, ALT 15. Albumin 2.4.  White blood cells 5.9, hemoglobin 10.7, hematocrit 31, platelets 167.  TSH 0.065.   RECOMMENDATIONS:  Continue physical and occupational therapy at the  skilled nursing facility. To follow up with  Dr. Doristine Counter regarding chronic medical problems upon discharge from the  skilled nursing facility.      Mobolaji B. Corky Downs, M.D.  Electronically Signed     MBB/MEDQ  D:  10/14/2006  T:  10/14/2006  Job:  [id number]   cc:   Marjory Lies, M.D.  Fax: [phone]

## 2011-02-06 NOTE — H&P (Signed)
Christina [name]               ACCOUNT NO.:  [id number]   MEDICAL RECORD NO.:  [id number]          PATIENT TYPE:  INP   LOCATION:  1618                         FACILITY:  [facility]   PHYSICIAN:  Hettie Holstein, D.O.    DATE OF BIRTH:  1925-02-09   DATE OF ADMISSION:  10/10/2006  DATE OF DISCHARGE:                              HISTORY & PHYSICAL   PRIMARY CARE PHYSICIAN:  Christina [name], M.D.   CHIEF COMPLAINT:  Reaction to fentanyl patch.   HISTORY OF PRESENT ILLNESS:  Christina [name] was a very pleasant 77 year old  independent living Caucasian female.  She suffers from degenerative disk  disease, status post laminectomy back in 1999 that had been complicated  by MRSA infection.  She has been followed by the Pain Clinic and Dr.  Modesto Charon for chronic pain management.  She had been on Ultram for a long  period time and recently transitioned to a fentanyl patch about 5 days  ago.  She describes inability to even turn in bed and move, and stated  that she was unable to walk.  She presented to the emergency department  and was evaluated by Dr. Preston Fleeting.  Shortly after removal of the patch  (around 8:30), she had begun to regain her ability to move; these  symptoms were treated to the fentanyl patch (though the dose was only  12.5 mcg).   In any event, in the emergency department she was discovered to have a  pyelonephritis with a low-grade temperature and too numerous to count  WBCs in her urine.  She continued to have pain.  She was quite  tachycardic and hypertensive.  She is being admitted for further pain  management and treatment initiation of IV antibiotics, culture and  sensitivity, and perhaps some physical therapy and occupational therapy.   PAST MEDICAL HISTORY:  Is significant for:  1. Recently well-healed traumatic stasis ulcer, that was treated by      the [facility] ([facility]  2. She is status post laminectomy in 1999, that was complicated by      MRSA infection.  \  3. History a rotator cuff repair in 1994.  4. History of hypothyroidism.  5. History of chronic right lower extremity weakness, secondary to the      above.   MEDICATIONS AT HOME:  She is unaware of the dosages, with the exception  of:  1. Synthroid 100 mcg daily.  2. Trazodone 12.5 mg daily.  3. Wellbutrin (she does not know the dose).  4. She was on Ultram; dosage  not clear.  However, she states with the      extended release form.  5. Recently a fentanyl patch 12.5 mg q.72 h.   ALLERGIES:  She reports allergy to SUDAFED, which caused some altered  mental status.   FAMILY HISTORY:  Noncontributory.   SOCIAL HISTORY:  The patient is a retired Astronomer. She has four children.  Her daughter Christina [name] can be reached at [phone].  She does not smoke, but  she does report to daily intake of a glass of wine.   REVIEW  OF SYSTEMS:  She had been in her usual state of health.  She  ambulates without a walker generally and remains quite active.  No  nausea, vomiting, diarrhea, chest pain, shortness of breath,  hematemesis, hematochezia or melena.  Further review of systems  unremarkable.   PHYSICAL EXAMINATION:  VITAL SIGNS:  In the emergency department her  blood pressure was 168/94, heart rate 100-130, respirations 16, O2  saturations 98% on room air.  GENERAL:  The patient was rolling back and forth in bed.  She appeared  to be quite uncomfortable; though most of her pain she states has been  chronic, with low back discomfort predominantly.  HEENT:  Revealed the head to be normocephalic, atraumatic.  Extraocular  muscles are intact.  NECK:  Supple, nontender.  No palpable thyromegaly or mass.  CARDIOVASCULAR: Revealed normal S1-S2, with tachycardia  ABDOMEN:  Soft.  No rebound or guarding.  No suprapubic tenderness.  EXTREMITIES:  Lower Extremities reveal no edema and no calf tenderness.  Peripheral pulses were symmetrical and palpable.   LABORATORY DATA:  Sodium 135, potassium 4, BUN 27,  creatinine 1.1,  glucose 131.  WBC 11.9, hemoglobin 12.6, platelet count 215, MCV 93.  CT  of the head was negative, with exception of only atrophy.  Urinalysis  revealed too numerous to count white blood cells.   ASSESSMENT:  1. Pyelonephritis.  2. Chronic low back pain.  3. Tachycardia.  4. Hypertension.  5. Acute pain.  6. Hypothyroidism.  7. Drug reaction/fentanyl.   PLAN:  At this time we are going to admit Christina [name].  We will start a  course of IV antibiotics, check a urine culture and treat her pain.  Perhaps some physical therapy, occupational therapy would be within  reason.  Will follow her clinical course.      Hettie Holstein, D.O.  Electronically Signed     ESS/MEDQ  D:  10/10/2006  T:  10/10/2006  Job:  [id number]   cc:   Christina [name], M.D.  Fax: [phone]

## 2011-05-15 ENCOUNTER — Other Ambulatory Visit: Payer: Self-pay | Admitting: Gastroenterology

## 2011-05-15 DIAGNOSIS — IMO0002 Reserved for concepts with insufficient information to code with codable children: Secondary | ICD-10-CM

## 2011-05-15 DIAGNOSIS — Z79899 Other long term (current) drug therapy: Secondary | ICD-10-CM

## 2011-05-19 ENCOUNTER — Other Ambulatory Visit: Payer: Medicare Other

## 2011-05-26 ENCOUNTER — Ambulatory Visit
Admission: RE | Admit: 2011-05-26 | Discharge: 2011-05-26 | Disposition: A | Discharge status: Home / Self Care | Payer: Medicare Other | Source: Ambulatory Visit | Attending: Gastroenterology | Admitting: Gastroenterology

## 2011-05-26 DIAGNOSIS — Z79899 Other long term (current) drug therapy: Secondary | ICD-10-CM

## 2011-05-27 ENCOUNTER — Emergency Department (HOSPITAL_BASED_OUTPATIENT_CLINIC_OR_DEPARTMENT_OTHER)
Admission: EM | Admit: 2011-05-27 | Discharge: 2011-05-28 | Disposition: A | Discharge status: Home / Self Care | Payer: Medicare Other | Attending: Emergency Medicine | Admitting: Emergency Medicine

## 2011-05-27 ENCOUNTER — Encounter: Payer: Self-pay | Admitting: *Deleted

## 2011-05-27 DIAGNOSIS — Y9210 Unspecified residential institution as the place of occurrence of the external cause: Secondary | ICD-10-CM | POA: Insufficient documentation

## 2011-05-27 DIAGNOSIS — E079 Disorder of thyroid, unspecified: Secondary | ICD-10-CM | POA: Insufficient documentation

## 2011-05-27 DIAGNOSIS — IMO0002 Reserved for concepts with insufficient information to code with codable children: Secondary | ICD-10-CM | POA: Insufficient documentation

## 2011-05-27 DIAGNOSIS — Z9181 History of falling: Secondary | ICD-10-CM

## 2011-05-27 DIAGNOSIS — I4891 Unspecified atrial fibrillation: Secondary | ICD-10-CM | POA: Insufficient documentation

## 2011-05-27 DIAGNOSIS — W010XXA Fall on same level from slipping, tripping and stumbling without subsequent striking against object, initial encounter: Secondary | ICD-10-CM | POA: Insufficient documentation

## 2011-05-27 DIAGNOSIS — I10 Essential (primary) hypertension: Secondary | ICD-10-CM | POA: Insufficient documentation

## 2011-05-27 HISTORY — DX: Essential (primary) hypertension: I10

## 2011-05-27 HISTORY — DX: Unspecified atrial fibrillation: I48.91

## 2011-05-27 HISTORY — DX: Unspecified intestinal obstruction, unspecified as to partial versus complete obstruction: K56.609

## 2011-05-27 HISTORY — DX: Disorder of thyroid, unspecified: E07.9

## 2011-05-27 MED ORDER — HYDROMORPHONE HCL 1 MG/ML IJ SOLN: 0.5000 mg | Freq: Once | INTRAMUSCULAR | Status: DC

## 2011-05-27 MED ORDER — HYDROMORPHONE HCL 1 MG/ML IJ SOLN
INTRAMUSCULAR | Status: AC
  Filled 2011-05-27: qty 1

## 2011-05-27 MED ORDER — TETANUS-DIPHTHERIA TOXOIDS TD 5-2 LFU IM INJ
0.5000 mL | INJECTION | Freq: Once | INTRAMUSCULAR | Status: AC
  Administered 2011-05-27: 0.5 mL via INTRAMUSCULAR
  Filled 2011-05-27: qty 0.5

## 2011-05-27 NOTE — ED Notes (Signed)
Pt c/o fall today with skin tear to right arm

## 2011-05-28 ENCOUNTER — Emergency Department (HOSPITAL_COMMUNITY)
Admission: EM | Admit: 2011-05-28 | Discharge: 2011-05-28 | Disposition: A | Discharge status: Home / Self Care | Payer: Medicare Other | Attending: Emergency Medicine | Admitting: Emergency Medicine

## 2011-05-28 DIAGNOSIS — N319 Neuromuscular dysfunction of bladder, unspecified: Secondary | ICD-10-CM | POA: Insufficient documentation

## 2011-05-28 DIAGNOSIS — S40029A Contusion of unspecified upper arm, initial encounter: Secondary | ICD-10-CM | POA: Insufficient documentation

## 2011-05-28 DIAGNOSIS — Y92009 Unspecified place in unspecified non-institutional (private) residence as the place of occurrence of the external cause: Secondary | ICD-10-CM | POA: Insufficient documentation

## 2011-05-28 DIAGNOSIS — R296 Repeated falls: Secondary | ICD-10-CM | POA: Insufficient documentation

## 2011-05-28 DIAGNOSIS — I4891 Unspecified atrial fibrillation: Secondary | ICD-10-CM | POA: Insufficient documentation

## 2011-05-28 DIAGNOSIS — Z09 Encounter for follow-up examination after completed treatment for conditions other than malignant neoplasm: Secondary | ICD-10-CM | POA: Insufficient documentation

## 2011-05-28 DIAGNOSIS — E039 Hypothyroidism, unspecified: Secondary | ICD-10-CM | POA: Insufficient documentation

## 2011-05-28 DIAGNOSIS — IMO0002 Reserved for concepts with insufficient information to code with codable children: Secondary | ICD-10-CM | POA: Insufficient documentation

## 2011-05-28 NOTE — ED Provider Notes (Signed)
History     CSN: [id number] Arrival date & time: 05/27/2011 11:08 PM  Chief Complaint  Patient presents with  . Fall  . Abrasion   Patient is a 77 y.o. female presenting with fall. The history is provided by the patient.  Fall The accident occurred 1 to 2 hours ago. The fall occurred while walking. Distance fallen: GLF while using her walker, she got her sleeve caught on a rose bush and it caused her to loose her balance and fall onto her R arm.  She sustained skin tears in the area of her elbow. she declines any xrays, moves her arm with NAD and denies any weakness  She landed on concrete. The point of impact was the right shoulder and right elbow. The pain is moderate. Pertinent negatives include no visual change, no fever, no numbness, no abdominal pain, no vomiting, no headaches and no tingling. Exacerbated by: nothing. She has tried nothing for the symptoms.  PT fell about a week ago and still has bruising from that episode. She denies hitting her head or hurting her neck. No chest/ ABD/ pelvis/ back or LE injury  Past Medical History  Diagnosis Date  . Back pain   . Thyroid disease   . Atrial fibrillation   . Hypertension   . SBO (small bowel obstruction)     Past Surgical History  Procedure Date  . Back surgery   . Abdominal hysterectomy     History reviewed. No pertinent family history.  History  Substance Use Topics  . Smoking status: Former Games developer  . Smokeless tobacco: Not on file  . Alcohol Use: No    OB History    Grav Para Term Preterm Abortions TAB SAB Ect Mult Living                  Review of Systems  Constitutional: Negative for fever and chills.  HENT: Negative for neck pain and neck stiffness.   Eyes: Negative for pain.  Respiratory: Negative for shortness of breath.   Cardiovascular: Negative for chest pain and leg swelling.  Gastrointestinal: Negative for vomiting, abdominal pain and abdominal distention.  Genitourinary: Negative for dysuria.    Musculoskeletal: Negative for back pain.  Skin: Positive for wound. Negative for rash.  Neurological: Negative for tingling, numbness and headaches.  All other systems reviewed and are negative.    Physical Exam  BP 153/93  Temp(Src) 98.9 F (37.2 C) (Oral)  Resp 16  Wt 180 lb (81.647 kg)  SpO2 100%  Physical Exam  Constitutional: She is oriented to person, place, and time. She appears well-developed and well-nourished.  HENT:  Head: Normocephalic and atraumatic.  Eyes: Conjunctivae and EOM are normal. Pupils are equal, round, and reactive to light.  Neck: Full passive range of motion without pain. Neck supple. No thyromegaly present.       No meningismus  Cardiovascular: Normal rate, regular rhythm, S1 normal, S2 normal and intact distal pulses.   Pulmonary/Chest: Effort normal and breath sounds normal.  Abdominal: Soft. Bowel sounds are normal. There is no tenderness. There is no CVA tenderness.  Musculoskeletal: Normal range of motion.       RUE: skin tears to her elbow and upper arm, no bony deformity or swelling, FROM at shoulder/ elbow/ wrist with distal N/V intact. No deep lacs or active bleeding  Neurological: She is alert and oriented to person, place, and time. She has normal strength and normal reflexes. No cranial nerve deficit or sensory deficit. She  displays a negative Romberg sign. GCS eye subscore is 4. GCS verbal subscore is 5. GCS motor subscore is 6.       Normal Gait  Skin: Skin is warm and dry. No rash noted. No cyanosis. Nails show no clubbing.  Psychiatric: She has a normal mood and affect. Her speech is normal and behavior is normal.    ED Course  Procedures  MDM GLF and RUE skin tears, declines any imaging and has no obvious bony injury and no deficits on exam. Tetanus updated, pain control IM dilaudid by request.  Wound care provided and skin tears proximated with steri strips. Family bedside. PT stable for d/c home. Pain improved on  recheck.      Sunnie Nielsen, MD 05/28/11 [phone]

## 2011-06-18 ENCOUNTER — Encounter (HOSPITAL_BASED_OUTPATIENT_CLINIC_OR_DEPARTMENT_OTHER): Payer: Medicare Other

## 2011-06-23 LAB — POCT I-STAT, CHEM 8
BUN: 33 — ABNORMAL HIGH
Chloride: 103
Creatinine, Ser: 1.8 — ABNORMAL HIGH
Sodium: 140

## 2011-07-16 ENCOUNTER — Encounter (HOSPITAL_BASED_OUTPATIENT_CLINIC_OR_DEPARTMENT_OTHER): Payer: Medicare Other

## 2011-10-18 ENCOUNTER — Encounter (HOSPITAL_COMMUNITY): Payer: Self-pay | Admitting: Emergency Medicine

## 2011-10-18 ENCOUNTER — Inpatient Hospital Stay (HOSPITAL_COMMUNITY)
Admission: EM | Admit: 2011-10-18 | Discharge: 2011-10-21 | DRG: 292 | Disposition: A | Discharge status: Skilled Nursing Facility | Payer: Medicare Other | Attending: Internal Medicine | Admitting: Internal Medicine

## 2011-10-18 ENCOUNTER — Emergency Department (HOSPITAL_COMMUNITY): Payer: Medicare Other

## 2011-10-18 ENCOUNTER — Other Ambulatory Visit: Payer: Self-pay

## 2011-10-18 DIAGNOSIS — I509 Heart failure, unspecified: Principal | ICD-10-CM | POA: Diagnosis present

## 2011-10-18 DIAGNOSIS — E039 Hypothyroidism, unspecified: Secondary | ICD-10-CM | POA: Diagnosis present

## 2011-10-18 DIAGNOSIS — R0602 Shortness of breath: Secondary | ICD-10-CM | POA: Diagnosis present

## 2011-10-18 DIAGNOSIS — I10 Essential (primary) hypertension: Secondary | ICD-10-CM | POA: Diagnosis present

## 2011-10-18 DIAGNOSIS — I129 Hypertensive chronic kidney disease with stage 1 through stage 4 chronic kidney disease, or unspecified chronic kidney disease: Secondary | ICD-10-CM | POA: Diagnosis present

## 2011-10-18 DIAGNOSIS — N179 Acute kidney failure, unspecified: Secondary | ICD-10-CM | POA: Diagnosis present

## 2011-10-18 DIAGNOSIS — N183 Chronic kidney disease, stage 3 unspecified: Secondary | ICD-10-CM | POA: Diagnosis present

## 2011-10-18 DIAGNOSIS — I4891 Unspecified atrial fibrillation: Secondary | ICD-10-CM | POA: Diagnosis present

## 2011-10-18 HISTORY — DX: Hypothyroidism, unspecified: E03.9

## 2011-10-18 LAB — POCT I-STAT, CHEM 8
Creatinine, Ser: 1.2 mg/dL — ABNORMAL HIGH (ref 0.50–1.10)
Glucose, Bld: 109 mg/dL — ABNORMAL HIGH (ref 70–99)
Hemoglobin: 9.9 g/dL — ABNORMAL LOW (ref 12.0–15.0)
Sodium: 137 mEq/L (ref 135–145)
TCO2: 31 mmol/L (ref 0–100)

## 2011-10-18 LAB — URINALYSIS, ROUTINE W REFLEX MICROSCOPIC
Bilirubin Urine: NEGATIVE
Hgb urine dipstick: NEGATIVE
Protein, ur: NEGATIVE mg/dL
Urobilinogen, UA: 0.2 mg/dL (ref 0.0–1.0)

## 2011-10-18 LAB — PRO B NATRIURETIC PEPTIDE: Pro B Natriuretic peptide (BNP): 3053 pg/mL — ABNORMAL HIGH (ref 0–450)

## 2011-10-18 LAB — CBC
HCT: 29.5 % — ABNORMAL LOW (ref 36.0–46.0)
Hemoglobin: 9.8 g/dL — ABNORMAL LOW (ref 12.0–15.0)
WBC: 6.1 10*3/uL (ref 4.0–10.5)

## 2011-10-18 MED ORDER — ASPIRIN 81 MG PO CHEW
324.0000 mg | CHEWABLE_TABLET | Freq: Once | ORAL | Status: AC
  Administered 2011-10-18: 324 mg via ORAL
  Filled 2011-10-18: qty 4

## 2011-10-18 MED ORDER — NITROGLYCERIN 2 % TD OINT
1.0000 [in_us] | TOPICAL_OINTMENT | Freq: Four times a day (QID) | TRANSDERMAL | Status: DC
  Administered 2011-10-18: 1 [in_us] via TOPICAL
  Filled 2011-10-18: qty 30

## 2011-10-18 MED ORDER — NITROGLYCERIN 0.4 MG SL SUBL: 0.4000 mg | SUBLINGUAL_TABLET | SUBLINGUAL | Status: DC | PRN

## 2011-10-18 MED ORDER — MORPHINE SULFATE 2 MG/ML IJ SOLN
2.0000 mg | Freq: Once | INTRAMUSCULAR | Status: AC
  Administered 2011-10-18: 2 mg via INTRAVENOUS
  Filled 2011-10-18: qty 1

## 2011-10-18 MED ORDER — HYDROCODONE-ACETAMINOPHEN 5-325 MG PO TABS
1.0000 | ORAL_TABLET | Freq: Once | ORAL | Status: AC
  Administered 2011-10-18: 1 via ORAL
  Filled 2011-10-18: qty 1

## 2011-10-18 MED ORDER — POTASSIUM CHLORIDE CRYS ER 20 MEQ PO TBCR
40.0000 meq | EXTENDED_RELEASE_TABLET | Freq: Once | ORAL | Status: AC
  Administered 2011-10-18: 40 meq via ORAL
  Filled 2011-10-18: qty 2

## 2011-10-18 MED ORDER — FUROSEMIDE 10 MG/ML IJ SOLN
40.0000 mg | INTRAMUSCULAR | Status: AC
  Administered 2011-10-18: 40 mg via INTRAVENOUS
  Filled 2011-10-18 (×2): qty 4

## 2011-10-18 NOTE — ED Notes (Signed)
Patient now stating she feels like her "mental status is off" pt is asking same question repeatedly, pt does not recall previous conversation. Pt unable to lie still on stretcher, IV site re-enforced d/t excessive bending, some blood noted around insertion site. Pt does state pain to back increased.

## 2011-10-18 NOTE — ED Notes (Signed)
Dr. Cena Benton at bedside for admission evaluation

## 2011-10-18 NOTE — ED Provider Notes (Signed)
History     CSN: [id number]  Arrival date & time 10/18/11  1546   First MD Initiated Contact with Patient 10/18/11 1555      Chief Complaint  Patient presents with  . Shortness of Breath  . Leg Swelling    (Consider location/radiation/quality/duration/timing/severity/associated sxs/prior treatment) HPI Patient is an 78 yo F who presents today with complaint of shortness of breath.  Patient just returned home from a cruise to [place] today.  She denies any chest pain , cough, or fever.  She denies any history of heart failure but does have a history of peripheral edema for which she normally takes lasix.  She noted peripheral edema developing while she was on her cruise but did not have her medications.  She is hemodynamically stable on arrival and denies shortness of breath while she is lying still.  Patient does have a history of a-fib but does not take any anti-coagulation for thi.  This is a decision she has made after having been on coumadin and ASA. There are no other associated or modifying factors. Past Medical History  Diagnosis Date  . Back pain   . Thyroid disease   . Atrial fibrillation   . Hypertension   . SBO (small bowel obstruction)     Past Surgical History  Procedure Date  . Back surgery   . Abdominal hysterectomy     History reviewed. No pertinent family history.  History  Substance Use Topics  . Smoking status: Former Games developer  . Smokeless tobacco: Not on file  . Alcohol Use: No    OB History    Grav Para Term Preterm Abortions TAB SAB Ect Mult Living                  Review of Systems  Constitutional: Negative.   HENT: Negative.   Eyes: Negative.   Respiratory: Positive for shortness of breath.   Cardiovascular: Positive for leg swelling.  Gastrointestinal: Negative.   Genitourinary: Negative.   Musculoskeletal: Negative.   Skin: Negative.   Neurological: Negative.   Hematological: Negative.   Psychiatric/Behavioral: Negative.   All other  systems reviewed and are negative.    Allergies  Lisinopril; Lyrica; Macrodantin; and Sudafed  Home Medications   Current Outpatient Rx  Name Route Sig Dispense Refill  . ACETAMINOPHEN 325 MG PO TABS Oral Take 650 mg by mouth 3 (three) times daily.    . BUDESONIDE ER 3 MG PO CP24 Oral Take 3 mg by mouth every morning.      Marland Kitchen VITAMIN D 1000 UNITS PO CAPS Oral Take 1,000 Units by mouth once a week. On monday    . DONEPEZIL HCL 10 MG PO TABS Oral Take 10 mg by mouth daily.     . FUROSEMIDE 20 MG PO TABS Oral Take 20 mg by mouth daily as needed. swelling    . HYDROCHLOROTHIAZIDE 25 MG PO TABS Oral Take 25 mg by mouth daily.    Marland Kitchen LEVOTHYROXINE SODIUM 50 MCG PO TABS Oral Take 50 mcg by mouth daily.     Marland Kitchen LIDOCAINE 5 % EX PTCH Transdermal Place 2 patches onto the skin daily as needed. Remove & Discard patch within 12 hours or as directed by MD    . MAGNESIUM OXIDE 400 MG PO TABS Oral Take 400 mg by mouth daily.      Marland Kitchen MEMANTINE HCL 10 MG PO TABS Oral Take 10 mg by mouth 2 (two) times daily.      Marland Kitchen MIRABEGRON  ER 25 MG PO TB24 Oral Take 1 tablet by mouth daily.    Marland Kitchen NAPROXEN SODIUM 220 MG PO TABS Oral Take 220 mg by mouth daily as needed. pain    . PROBIOTIC FORMULA PO Oral Take 1 capsule by mouth daily.      . TRAMADOL HCL 50 MG PO TABS Oral Take 50 mg by mouth 2 (two) times daily.    . TRAZODONE HCL 50 MG PO TABS Oral Take 50 mg by mouth at bedtime.     Marland Kitchen VITAMIN B-12 1000 MCG PO TABS Oral Take 1,000 mcg by mouth daily.        BP 180/107  Pulse 75  Temp(Src) 98.7 F (37.1 C) (Oral)  Resp 22  SpO2 98%  Physical Exam  Vitals reviewed. Constitutional: She is oriented to person, place, and time. She appears well-developed and well-nourished. No distress.  HENT:  Head: Normocephalic and atraumatic.  Eyes: Conjunctivae and EOM are normal. Pupils are equal, round, and reactive to light.  Neck: Normal range of motion.  Cardiovascular: Normal rate, regular rhythm, normal heart sounds and  intact distal pulses.  Exam reveals no gallop and no friction rub.   No murmur heard. Pulmonary/Chest: Effort normal and breath sounds normal. No respiratory distress. She has no wheezes. She has no rales.  Abdominal: Soft. Bowel sounds are normal. She exhibits no distension. There is no tenderness. There is no rebound and no guarding.  Musculoskeletal: Normal range of motion. She exhibits edema. She exhibits no tenderness.  Neurological: She is alert and oriented to person, place, and time. No cranial nerve deficit. She exhibits normal muscle tone. Coordination normal.  Skin: Skin is warm and dry. No rash noted.  Psychiatric: She has a normal mood and affect.    ED Course  Procedures (including critical care time)   Date: 10/18/2011  Rate: 82  Rhythm: atrial fibrillation  QRS Axis: normal  Intervals: normal  ST/T Wave abnormalities: nonspecific T wave changes  Conduction Disutrbances:none  Narrative Interpretation:   Old EKG Reviewed: unchanged  Labs Reviewed  PRO B NATRIURETIC PEPTIDE - Abnormal; Notable for the following:    Pro B Natriuretic peptide (BNP) 3053.0 (*)    All other components within normal limits  CBC - Abnormal; Notable for the following:    RBC 3.21 (*)    Hemoglobin 9.8 (*)    HCT 29.5 (*)    Platelets 137 (*)    All other components within normal limits  POCT I-STAT, CHEM 8 - Abnormal; Notable for the following:    Potassium 3.2 (*)    BUN 40 (*)    Creatinine, Ser 1.20 (*)    Glucose, Bld 109 (*)    Hemoglobin 9.9 (*)    HCT 29.0 (*)    All other components within normal limits  URINALYSIS, ROUTINE W REFLEX MICROSCOPIC  POCT I-STAT TROPONIN I  I-STAT TROPONIN I  I-STAT, CHEM 8   Dg Chest 2 View  10/18/2011  *RADIOLOGY REPORT*  Clinical Data: Shortness of breath.  SOB.  CHEST - 2 VIEW  Comparison: 09/28/2010  Findings: Heart size is normal.  No pleural effusion or pulmonary edema.  Bronchitic changes noted bilaterally.  No superimposed airspace  consolidation identified.  IMPRESSION:  1.  Bronchitic changes. 2.  No evidence for pneumonia.  Original Report Authenticated By: Rosealee Albee, M.D.     1. Heart failure of unknown type       MDM  Patient was evaluated.  Based on  peripheral edema and patient having no access to her lasix this was thought to be an exacerbation of some previously undiagnosed heart failure given her recent cruise and history of a-fib.  Patient had elevated BNP.  Her CXR was stable as were her hemodynamics and she was treated with IV lasix.  TNI and other lab studies were unremarkable.  Patient had put out over 1 L of urine and was feeling better but was somewhat uncomfortable with discharge.  I spoke with Dr. Cena Benton regarding admission.  He spoke with the patient and discussed the possibility of discharge home.  She was comfortable with this but family preferred admission.  Patient was admitted for further diuresis and monitoring overnight.        Cyndra Numbers, MD 10/26/11 1122

## 2011-10-18 NOTE — H&P (Signed)
PCP:  Benedetto Goad  Chief Complaint:  SOB  HPI: Pt is an 78 y/o with h/o atrial fibrillation, htn, chronic back pain.  She is presenting to the ED c/o shortness of breath.  Pt recently went on a cruise to [place] and mentions that she stayed active and denies any prolonged trips (airplane trip was 2 hours).  She mentions that she usually takes hctz but forgot to take it during her trip.  Daughter reports that when she initially saw her mother 30 minutes after the trip she looked more swollen and was complaining of difficulty breathing.  Thus she was sent to the ED for further evaluation.  In the ED patient had work-up which showed an elevated BNP of 3,053 and a negative chest x ray.  Pt was afebrile and WBC was within normal limits.  Subsequently was given lasix 40 mg in the ED.  Patient has since had 1600 ml output of urine and when I examined the patient in the room she was feeling better and speaking in full sentences.  She was on room air and was sating 98-100 percent.    Daughter reports that patient lives at home and there is concern that patient isn't able to properly take care of herselft at home.  Pt denies any cough, fever, chills, nausea, emesis.  Allergies:   Allergies  Allergen Reactions  . Lisinopril     cough  . Lyrica (Pregabalin)     loopy  . Macrodantin     unknown  . Sudafed (Pseudoephedrine Hcl)     unknown      Past Medical History  Diagnosis Date  . Back pain   . Thyroid disease   . Atrial fibrillation   . Hypertension   . SBO (small bowel obstruction)     Past Surgical History  Procedure Date  . Back surgery   . Abdominal hysterectomy     Prior to Admission medications   Medication Sig Start Date End Date Taking? Authorizing Provider  acetaminophen (TYLENOL) 325 MG tablet Take 650 mg by mouth 3 (three) times daily.   Yes Historical Provider, MD  budesonide (ENTOCORT EC) 3 MG 24 hr capsule Take 3 mg by mouth every morning.     Yes Historical  Provider, MD  Cholecalciferol (VITAMIN D) 1000 UNITS capsule Take 1,000 Units by mouth once a week. On monday   Yes Historical Provider, MD  donepezil (ARICEPT) 10 MG tablet Take 10 mg by mouth daily.    Yes Historical Provider, MD  furosemide (LASIX) 20 MG tablet Take 20 mg by mouth daily as needed. swelling   Yes Historical Provider, MD  hydrochlorothiazide (HYDRODIURIL) 25 MG tablet Take 25 mg by mouth daily.   Yes Historical Provider, MD  levothyroxine (SYNTHROID, LEVOTHROID) 50 MCG tablet Take 50 mcg by mouth daily.    Yes Historical Provider, MD  lidocaine (LIDODERM) 5 % Place 2 patches onto the skin daily as needed. Remove & Discard patch within 12 hours or as directed by MD   Yes Historical Provider, MD  magnesium oxide (MAG-OX) 400 MG tablet Take 400 mg by mouth daily.     Yes Historical Provider, MD  memantine (NAMENDA) 10 MG tablet Take 10 mg by mouth 2 (two) times daily.     Yes Historical Provider, MD  Mirabegron ER (MYRBETRIQ) 25 MG TB24 Take 1 tablet by mouth daily.   Yes Historical Provider, MD  naproxen sodium (ANAPROX) 220 MG tablet Take 220 mg by mouth daily as needed.  pain   Yes Historical Provider, MD  Probiotic Product (PROBIOTIC FORMULA PO) Take 1 capsule by mouth daily.     Yes Historical Provider, MD  traMADol (ULTRAM) 50 MG tablet Take 50 mg by mouth 2 (two) times daily.   Yes Historical Provider, MD  traZODone (DESYREL) 50 MG tablet Take 50 mg by mouth at bedtime.    Yes Historical Provider, MD  vitamin B-12 (CYANOCOBALAMIN) 1000 MCG tablet Take 1,000 mcg by mouth daily.     Yes Historical Provider, MD    Social History:  reports that she has quit smoking. She does not have any smokeless tobacco history on file. She reports that she does not drink alcohol or use illicit drugs.  History reviewed. No pertinent family history.  Review of Systems:  Constitutional: Denies fever, chills, diaphoresis, appetite change and fatigue.  HEENT: Denies photophobia, eye pain,  redness, hearing loss, ear pain, congestion, sore throat, rhinorrhea, sneezing, mouth sores, trouble swallowing, neck pain, neck stiffness and tinnitus.   Respiratory: Denies SOB, DOE, cough, chest tightness,  and wheezing.   Cardiovascular: Denies chest pain, palpitations and leg swelling.  Gastrointestinal: Denies nausea, vomiting, abdominal pain, diarrhea, constipation, blood in stool and abdominal distention.  Genitourinary: Denies dysuria, urgency, frequency, hematuria, flank pain and difficulty urinating.  Musculoskeletal: Denies myalgias, back pain, joint swelling, arthralgias and gait problem.  Skin: Denies pallor, rash and wound.  Neurological: Denies dizziness, seizures, syncope, weakness, light-headedness, numbness and headaches.  Hematological: Denies adenopathy. Easy bruising, personal or family bleeding history  Psychiatric/Behavioral: Denies suicidal ideation, mood changes, confusion, nervousness, sleep disturbance and agitation   Physical Exam: Blood pressure 161/104, pulse 89, temperature 98.7 F (37.1 C), temperature source Oral, resp. rate 17, SpO2 93.00%. General: Alert, awake, oriented x3, in no acute distress. HEENT: No bruits, no goiter. Heart: Regular rate and rhythm, without murmurs, rubs, gallops. Lungs: Clear to auscultation bilaterally. No increase work of breathing Abdomen: Soft, nontender, nondistended, positive bowel sounds. Extremities: No clubbing or cyanosis, + non pitting edema with positive pedal pulses. Neuro: Grossly intact, nonfocal.    Labs on Admission:  Results for orders placed during the hospital encounter of 10/18/11 (from the past 48 hour(s))  PRO B NATRIURETIC PEPTIDE     Status: Abnormal   Collection Time   10/18/11  4:10 PM      Component Value Range Comment   Pro B Natriuretic peptide (BNP) 3053.0 (*) 0 - 450 (pg/mL)   CBC     Status: Abnormal   Collection Time   10/18/11  4:10 PM      Component Value Range Comment   WBC 6.1  4.0 -  10.5 (K/uL)    RBC 3.21 (*) 3.87 - 5.11 (MIL/uL)    Hemoglobin 9.8 (*) 12.0 - 15.0 (g/dL)    HCT 16.1 (*) 09.6 - 46.0 (%)    MCV 91.9  78.0 - 100.0 (fL)    MCH 30.5  26.0 - 34.0 (pg)    MCHC 33.2  30.0 - 36.0 (g/dL)    RDW 04.5  40.9 - 81.1 (%)    Platelets 137 (*) 150 - 400 (K/uL)   URINALYSIS, ROUTINE W REFLEX MICROSCOPIC     Status: Normal   Collection Time   10/18/11  4:20 PM      Component Value Range Comment   Color, Urine YELLOW  YELLOW     APPearance CLEAR  CLEAR     Specific Gravity, Urine 1.010  1.005 - 1.030     pH  6.0  5.0 - 8.0     Glucose, UA NEGATIVE  NEGATIVE (mg/dL)    Hgb urine dipstick NEGATIVE  NEGATIVE     Bilirubin Urine NEGATIVE  NEGATIVE     Ketones, ur NEGATIVE  NEGATIVE (mg/dL)    Protein, ur NEGATIVE  NEGATIVE (mg/dL)    Urobilinogen, UA 0.2  0.0 - 1.0 (mg/dL)    Nitrite NEGATIVE  NEGATIVE     Leukocytes, UA NEGATIVE  NEGATIVE  MICROSCOPIC NOT DONE ON URINES WITH NEGATIVE PROTEIN, BLOOD, LEUKOCYTES, NITRITE, OR GLUCOSE <1000 mg/dL.  POCT I-STAT TROPONIN I     Status: Normal   Collection Time   10/18/11  4:28 PM      Component Value Range Comment   Troponin i, poc 0.01  0.00 - 0.08 (ng/mL)    Comment 3            POCT I-STAT, CHEM 8     Status: Abnormal   Collection Time   10/18/11  4:30 PM      Component Value Range Comment   Sodium 137  135 - 145 (mEq/L)    Potassium 3.2 (*) 3.5 - 5.1 (mEq/L)    Chloride 100  96 - 112 (mEq/L)    BUN 40 (*) 6 - 23 (mg/dL)    Creatinine, Ser 1.61 (*) 0.50 - 1.10 (mg/dL)    Glucose, Bld 096 (*) 70 - 99 (mg/dL)    Calcium, Ion 0.45  1.12 - 1.32 (mmol/L)    TCO2 31  0 - 100 (mmol/L)    Hemoglobin 9.9 (*) 12.0 - 15.0 (g/dL)    HCT 40.9 (*) 81.1 - 46.0 (%)     Radiological Exams on Admission: Dg Chest 2 View  10/18/2011  *RADIOLOGY REPORT*  Clinical Data: Shortness of breath.  SOB.  CHEST - 2 VIEW  Comparison: 09/28/2010  Findings: Heart size is normal.  No pleural effusion or pulmonary edema.  Bronchitic changes  noted bilaterally.  No superimposed airspace consolidation identified.  IMPRESSION:  1.  Bronchitic changes. 2.  No evidence for pneumonia.  Original Report Authenticated By: Rosealee Albee, M.D.    Assessment/Plan 1) SOB: Likely due to CHF exacerbation given elevated BNP and resolution of symptoms with lasix.  Patient doesn't have a formal diagnosis of CHF and thus I will order an echocardiogram.  -daily weights -I and O's -low sodium diet/cardiac -Tele given h/o A fib -cycle CE's although index of suspicion is low  Suspect that since patient went on cruise she may have not watched her diet and eaten foods high in salt.  Given that patient has had high diuresis and is currently asymptomatic will plan on placing on home dose of lasix.  Also will D/C NSAID.  Will add beta blocker.  2) Afib: Will plan on adding beta blocker  3) HTN: Pt is on HCTZ at home will plan on continuing and adding beta blocker.  4) hypothyroidism:  Will plan on continuing home regimen of synthroid  5) Chronic back pain:  Patient has h/o lower back surgeries.  Will plan on continuing tramadol  6) DVT prophylaxis: heparin  7) Hypokalemia:  Pt has been given 40 meq in the ED will recheck levels tomorrow.  Given history of current condition and patient and families concern regarding proper placement will order PT/OT to evaluate and treat tomorrow morning.   Time Spent on Admission: >70 min (more than half of time discussing with patient and family), physical exam, orders, reviewing chart and results, updating  information systems, billing, and documentation.  Penny Pia [facility] Pager: [phone] 10/18/2011, 11:04 PM

## 2011-10-18 NOTE — ED Notes (Addendum)
Was on cruise to [place]. Noticed increased swelling to lower ext after returning. Was not taking lasix on vacation just HCTZ. Pt took Lasix po pta on advise of her physician

## 2011-10-18 NOTE — ED Notes (Signed)
ZOX:[id number]<AV> Expected date:10/18/11<BR> Expected time: 3:52 PM<BR> Means of arrival:Ambulance<BR> Comments:<BR> M35 - 86yoF SOB, CHF, lasix given prior to EMS

## 2011-10-19 ENCOUNTER — Observation Stay (HOSPITAL_COMMUNITY): Payer: Medicare Other

## 2011-10-19 ENCOUNTER — Encounter (HOSPITAL_COMMUNITY): Payer: Self-pay | Admitting: *Deleted

## 2011-10-19 DIAGNOSIS — N183 Chronic kidney disease, stage 3 unspecified: Secondary | ICD-10-CM | POA: Diagnosis present

## 2011-10-19 DIAGNOSIS — I509 Heart failure, unspecified: Secondary | ICD-10-CM | POA: Diagnosis present

## 2011-10-19 LAB — CARDIAC PANEL(CRET KIN+CKTOT+MB+TROPI)
CK, MB: 4.5 ng/mL — ABNORMAL HIGH (ref 0.3–4.0)
Relative Index: INVALID (ref 0.0–2.5)
Total CK: 66 U/L (ref 7–177)

## 2011-10-19 LAB — BASIC METABOLIC PANEL
CO2: 30 mEq/L (ref 19–32)
Calcium: 8.6 mg/dL (ref 8.4–10.5)
Creatinine, Ser: 1.22 mg/dL — ABNORMAL HIGH (ref 0.50–1.10)
GFR calc Af Amer: 46 mL/min — ABNORMAL LOW (ref >90)
GFR calc non Af Amer: 40 mL/min — ABNORMAL LOW (ref >90)
Sodium: 137 mEq/L (ref 135–145)

## 2011-10-19 MED ORDER — FUROSEMIDE 10 MG/ML IJ SOLN
20.0000 mg | Freq: Two times a day (BID) | INTRAMUSCULAR | Status: DC
  Administered 2011-10-19 – 2011-10-20 (×3): 20 mg via INTRAVENOUS
  Filled 2011-10-19 (×6): qty 2

## 2011-10-19 MED ORDER — VITAMIN B-12 1000 MCG PO TABS
1000.0000 ug | ORAL_TABLET | Freq: Every day | ORAL | Status: DC
  Administered 2011-10-19 – 2011-10-21 (×3): 1000 ug via ORAL
  Filled 2011-10-19 (×3): qty 1

## 2011-10-19 MED ORDER — MEMANTINE HCL 10 MG PO TABS
10.0000 mg | ORAL_TABLET | Freq: Two times a day (BID) | ORAL | Status: DC
  Administered 2011-10-19 – 2011-10-21 (×5): 10 mg via ORAL
  Filled 2011-10-19 (×6): qty 1

## 2011-10-19 MED ORDER — HEPARIN SODIUM (PORCINE) 5000 UNIT/ML IJ SOLN
5000.0000 [IU] | Freq: Three times a day (TID) | INTRAMUSCULAR | Status: DC
  Filled 2011-10-19 (×10): qty 1

## 2011-10-19 MED ORDER — ACETAMINOPHEN 325 MG PO TABS
650.0000 mg | ORAL_TABLET | Freq: Four times a day (QID) | ORAL | Status: DC | PRN
  Administered 2011-10-19 – 2011-10-21 (×5): 650 mg via ORAL
  Filled 2011-10-19 (×4): qty 2

## 2011-10-19 MED ORDER — TRAMADOL HCL 50 MG PO TABS
50.0000 mg | ORAL_TABLET | Freq: Two times a day (BID) | ORAL | Status: DC
  Administered 2011-10-19 – 2011-10-21 (×6): 50 mg via ORAL
  Filled 2011-10-19 (×9): qty 1

## 2011-10-19 MED ORDER — TRAZODONE HCL 50 MG PO TABS
50.0000 mg | ORAL_TABLET | Freq: Every day | ORAL | Status: DC
  Administered 2011-10-19 – 2011-10-20 (×2): 50 mg via ORAL
  Filled 2011-10-19 (×3): qty 1

## 2011-10-19 MED ORDER — ACETAMINOPHEN 325 MG PO TABS
ORAL_TABLET | ORAL | Status: AC
  Administered 2011-10-19: 650 mg via ORAL
  Filled 2011-10-19: qty 1

## 2011-10-19 MED ORDER — ACETAMINOPHEN 325 MG PO TABS
ORAL_TABLET | ORAL | Status: AC
  Filled 2011-10-19: qty 2

## 2011-10-19 MED ORDER — HYDROCHLOROTHIAZIDE 25 MG PO TABS
25.0000 mg | ORAL_TABLET | Freq: Every day | ORAL | Status: DC
  Administered 2011-10-19 – 2011-10-21 (×3): 25 mg via ORAL
  Filled 2011-10-19 (×3): qty 1

## 2011-10-19 MED ORDER — POTASSIUM CHLORIDE CRYS ER 20 MEQ PO TBCR
40.0000 meq | EXTENDED_RELEASE_TABLET | Freq: Two times a day (BID) | ORAL | Status: DC
  Administered 2011-10-19 – 2011-10-21 (×4): 40 meq via ORAL
  Filled 2011-10-19 (×5): qty 2

## 2011-10-19 MED ORDER — METOPROLOL TARTRATE 25 MG PO TABS
25.0000 mg | ORAL_TABLET | Freq: Two times a day (BID) | ORAL | Status: DC
  Administered 2011-10-19 – 2011-10-21 (×6): 25 mg via ORAL
  Filled 2011-10-19 (×7): qty 1

## 2011-10-19 MED ORDER — TECHNETIUM TO 99M ALBUMIN AGGREGATED
5.1000 | Freq: Once | INTRAVENOUS | Status: AC | PRN
  Administered 2011-10-19: 5.1 via INTRAVENOUS

## 2011-10-19 MED ORDER — POTASSIUM CHLORIDE 20 MEQ PO PACK
40.0000 meq | PACK | Freq: Two times a day (BID) | ORAL | Status: DC
Start: 2011-10-19 — End: 2011-10-19
  Filled 2011-10-19: qty 2

## 2011-10-19 MED ORDER — XENON XE 133 GAS
9.0000 | GAS_FOR_INHALATION | Freq: Once | RESPIRATORY_TRACT | Status: AC | PRN
  Administered 2011-10-19: 9 via RESPIRATORY_TRACT

## 2011-10-19 MED ORDER — MORPHINE SULFATE 2 MG/ML IJ SOLN
2.0000 mg | INTRAMUSCULAR | Status: DC | PRN
  Administered 2011-10-19 (×4): 2 mg via INTRAVENOUS
  Filled 2011-10-19 (×4): qty 1

## 2011-10-19 MED ORDER — SODIUM CHLORIDE 0.9 % IJ SOLN
3.0000 mL | Freq: Two times a day (BID) | INTRAMUSCULAR | Status: DC
  Administered 2011-10-19 – 2011-10-21 (×6): 3 mL via INTRAVENOUS

## 2011-10-19 MED ORDER — SODIUM CHLORIDE 0.9 % IJ SOLN: 3.0000 mL | INTRAMUSCULAR | Status: DC | PRN

## 2011-10-19 MED ORDER — LIDOCAINE 5 % EX PTCH
2.0000 | MEDICATED_PATCH | Freq: Every day | CUTANEOUS | Status: DC | PRN
  Administered 2011-10-19 – 2011-10-21 (×2): 2 via TRANSDERMAL
  Filled 2011-10-19 (×2): qty 2

## 2011-10-19 MED ORDER — BUDESONIDE 3 MG PO CP24
3.0000 mg | ORAL_CAPSULE | Freq: Every day | ORAL | Status: DC
  Administered 2011-10-19 – 2011-10-21 (×3): 3 mg via ORAL
  Filled 2011-10-19 (×3): qty 1

## 2011-10-19 MED ORDER — LEVOTHYROXINE SODIUM 50 MCG PO TABS
50.0000 ug | ORAL_TABLET | Freq: Every day | ORAL | Status: DC
  Administered 2011-10-19 – 2011-10-21 (×3): 50 ug via ORAL
  Filled 2011-10-19 (×3): qty 1

## 2011-10-19 MED ORDER — FUROSEMIDE 20 MG PO TABS
20.0000 mg | ORAL_TABLET | Freq: Every day | ORAL | Status: DC
  Filled 2011-10-19: qty 1

## 2011-10-19 MED ORDER — SODIUM CHLORIDE 0.9 % IV SOLN: 250.0000 mL | INTRAVENOUS | Status: DC | PRN

## 2011-10-19 MED ORDER — DONEPEZIL HCL 10 MG PO TABS
10.0000 mg | ORAL_TABLET | Freq: Every day | ORAL | Status: DC
  Administered 2011-10-19 – 2011-10-20 (×2): 10 mg via ORAL
  Filled 2011-10-19 (×3): qty 1

## 2011-10-19 NOTE — Evaluation (Signed)
Physical Therapy Evaluation Patient Details Name: Christina [name] MRN: [id number] DOB: 1925-01-05 Today's Date: 10/19/2011  Problem List:  Patient Active Problem List  Diagnoses  . Shortness of breath  . A-fib  . HTN (hypertension)  . Hypothyroidism  . Acute on chronic kidney disease, stage 3  . CHF (congestive heart failure)    Past Medical History:  Past Medical History  Diagnosis Date  . Back pain   . Thyroid disease   . Atrial fibrillation   . Hypertension   . SBO (small bowel obstruction)   . Hypothyroidism    Past Surgical History:  Past Surgical History  Procedure Date  . Back surgery   . Abdominal hysterectomy     PT Assessment/Plan/Recommendation PT Assessment Clinical Impression Statement: pt will benefit from PT to maximize independence for next venue of care. PT Recommendation/Assessment: Patient will need skilled PT in the acute care venue PT Problem List: Decreased strength;Decreased mobility;Decreased activity tolerance;Decreased knowledge of use of DME;Decreased balance PT Therapy Diagnosis : Difficulty walking PT Plan PT Frequency: Min 3X/week PT Treatment/Interventions: Gait training;DME instruction;Stair training;Functional mobility training;Therapeutic activities;Therapeutic exercise;Balance training;Patient/family education PT Recommendation Follow Up Recommendations: Skilled nursing facility Equipment Recommended: Defer to next venue PT Goals  Acute Rehab PT Goals PT Goal Formulation: With patient Time For Goal Achievement: 2 weeks Pt will go Supine/Side to Sit: with modified independence PT Goal: Supine/Side to Sit - Progress: Goal set today Pt will go Sit to Supine/Side: with modified independence PT Goal: Sit to Supine/Side - Progress: Goal set today Pt will go Sit to Stand: with supervision PT Goal: Sit to Stand - Progress: Goal set today Pt will go Stand to Sit: with supervision PT Goal: Stand to Sit - Progress: Goal set today Pt will  Ambulate: 51 - 150 feet;with supervision PT Goal: Ambulate - Progress: Goal set today Pt will Perform Home Exercise Program: with supervision, verbal cues required/provided PT Goal: Perform Home Exercise Program - Progress: Goal set today  PT Evaluation Precautions/Restrictions  Precautions Precautions: Fall Prior Functioning  Home Living Lives With: Alone Type of Home: House Home Layout: One level Home Access: Ramped entrance Bathroom Shower/Tub: Health visitor: Standard Bathroom Accessibility: Yes How Accessible: Accessible via walker Home Adaptive Equipment: Walker - rolling;Walker - four wheeled Prior Function Level of Independence: Independent with homemaking with ambulation Driving: No Vocation: Retired (pt retired Engineer, 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 Arousal/Alertness: Awake/alert Overall Cognitive Status: Appears within functional limits for tasks assessed Cognition - Other Comments: Sensation/Coordination   Extremity Assessment RUE Assessment RUE Assessment: Within Functional Limits LUE Assessment LUE Assessment: Within Functional Limits RLE Assessment RLE Assessment:  (WFL for activites tested) LLE Assessment LLE Assessment:  (WFL for activities) Mobility (including Balance) Bed Mobility Bed Mobility: Yes Supine to Sit: 5: Supervision;With rails;HOB flat Sit to Supine: 5: Supervision Sit to Supine - Details (indicate cue type and reason): increased time Transfers Sit to Stand: 4: Min assist;With armrests;With upper extremity assist Sit to Stand Details (indicate cue type and reason): VCs for hand placement, posture and technique Stand to Sit: 4: Min assist;To bed;With upper extremity assist Stand to Sit Details: VCs for hand placement, posture and technique Ambulation/Gait Ambulation/Gait Assistance: 4: Min assist Ambulation/Gait Assistance Details (indicate cue type and reason): cues for RW safety and posture Ambulation  Distance (Feet): 70 Feet Assistive device: Rolling walker Gait Pattern: Step-to pattern;Step-through pattern;Trunk flexed (pt able to correct)    Exercise    End of Session PT - End of Session Equipment  Utilized During Treatment: Gait belt Activity Tolerance: Patient tolerated treatment well Patient left: in bed;with call bell in reach Nurse Communication: Mobility status for transfers;Mobility status for ambulation General Behavior During Session: [facility] for tasks performed Cognition: [facility] for tasks performed  [facility] 10/19/2011, 4:18 PM

## 2011-10-19 NOTE — ED Notes (Signed)
Attempted to call report, RN unavailable for report will return call

## 2011-10-19 NOTE — Progress Notes (Signed)
  Echocardiogram 2D Echocardiogram has been performed.  Juanita Laster Rudolph Dobler 10/19/2011, 12:52 PM

## 2011-10-19 NOTE — Progress Notes (Signed)
Patient ID: Christina [name], female   DOB: [birth year], 78 y.o.   MRN: [id number] Subjective: No events overnight. Patient denies chest pain, shortness of breath, abdominal pain.   Objective:  Vital signs in last 24 hours:  Filed Vitals:   10/19/11 0153 10/19/11 0622 10/19/11 0700 10/19/11 0741  BP: 170/86 176/80    Pulse: 70 78    Temp: 98.4 F (36.9 C) 98.9 F (37.2 C)    TempSrc: Oral Oral    Resp: 18 18    Height: 5\' 2"  (1.575 m)   5\' 2"  (1.575 m)  Weight: 77.1 kg (169 lb 15.6 oz)  77.1 kg (169 lb 15.6 oz) 77.1 kg (169 lb 15.6 oz)  SpO2: 93% 90%      Intake/Output from previous day:   Intake/Output Summary (Last 24 hours) at 10/19/11 0933 Last data filed at 10/19/11 0155  Gross per 24 hour  Intake      0 ml  Output    450 ml  Net   -450 ml    Physical Exam: General: Alert, awake, oriented x3, in no acute distress. HEENT: No bruits, no goiter. Moist mucous membranes, no scleral icterus, no conjunctival pallor. Heart: irregular rhythm, raet controlled, S1/S2 +, no murmurs, rubs, gallops. Lungs: Clear to auscultation bilaterally. No wheezing, no rhonchi, no rales.  Abdomen: Soft, nontender, nondistended, positive bowel sounds. Extremities: No clubbing or cyanosis, no pitting edema,  positive pedal pulses; multiple ecchymosis throughout lower and upper bilateral extremities Neuro: Grossly nonfocal.  Lab Results:  Basic Metabolic Panel:    Component Value Date/Time   NA 137 10/19/2011 0222   K 3.1* 10/19/2011 0222   CL 96 10/19/2011 0222   CO2 30 10/19/2011 0222   BUN 33* 10/19/2011 0222   CREATININE 1.22* 10/19/2011 0222   GLUCOSE 88 10/19/2011 0222   CALCIUM 8.6 10/19/2011 0222   CBC:    Component Value Date/Time   WBC 6.1 10/18/2011 1610   HGB 9.9* 10/18/2011 1630   HCT 29.0* 10/18/2011 1630   PLT 137* 10/18/2011 1610   MCV 91.9 10/18/2011 1610   NEUTROABS 7.8* 09/28/2010 1938   LYMPHSABS 2.0 09/28/2010 1938   MONOABS 0.8 09/28/2010 1938   EOSABS 0.1 09/28/2010 1938   BASOSABS 0.1 09/28/2010 1938      Lab 10/18/11 1630 10/18/11 1610  WBC -- 6.1  HGB 9.9* 9.8*  HCT 29.0* 29.5*  PLT -- 137*  MCV -- 91.9  MCH -- 30.5  MCHC -- 33.2  RDW -- 15.5  LYMPHSABS -- --  MONOABS -- --  EOSABS -- --  BASOSABS -- --  BANDABS -- --    Lab 10/19/11 0222 10/18/11 1630  NA 137 137  K 3.1* 3.2*  CL 96 100  CO2 30 --  GLUCOSE 88 109*  BUN 33* 40*  CREATININE 1.22* 1.20*  CALCIUM 8.6 --  MG -- --   No results found for this basename: INR:5,PROTIME:5 in the last 168 hours Cardiac markers:  Lab 10/19/11 0230  CKMB 4.5*  TROPONINI <0.30  MYOGLOBIN --   No components found with this basename: POCBNP:3 No results found for this or any previous visit (from the past 240 hour(s)).  Studies/Results:  Dg Chest 2 View 10/18/2011  IMPRESSION:  1.  Bronchitic changes. 2.  No evidence for pneumonia.     Medications: Scheduled Meds:   . aspirin  324 mg Oral Once  . budesonide  3 mg Oral Daily  . donepezil  10 mg Oral Daily  .  furosemide  20 mg Intravenous BID  . furosemide  40 mg Intravenous To ER  . heparin  5,000 Units Subcutaneous Q8H  . hydrochlorothiazide  25 mg Oral Daily  . HYDROcodone-acetaminophen  1 tablet Oral Once  . levothyroxine  50 mcg Oral Daily  . memantine  10 mg Oral BID  . metoprolol tartrate  25 mg Oral BID  .  morphine injection  2 mg Intravenous Once  .  morphine injection  2 mg Intravenous Once  . potassium chloride  40 mEq Oral Once  . sodium chloride  3 mL Intravenous Q12H  . traMADol  50 mg Oral BID  . traZODone  50 mg Oral QHS  . vitamin B-12  1,000 mcg Oral Daily  . DISCONTD: furosemide  20 mg Oral Daily  . DISCONTD: nitroGLYCERIN  1 inch Topical Q6H   Continuous Infusions:  PRN Meds:.sodium chloride, acetaminophen, lidocaine, morphine injection, sodium chloride, DISCONTD: nitroGLYCERIN  Assessment/Plan:  Principal Problem:   *SHORTNESS OF BREATH - likely secondary to CHF exacerbation in the setting of elevated  BNP - we will start low dose lasix 20 mg IV BID as patient has slightly elevated creatinine - follow up 2 D ECHO results - the 2 sets of cardiac enzymes are negative so far - daily weight - strict I&Os - monitor electrolytes, keep Magnesium level above 2 and potassium level above 4 - called Dr Jacinto Halim (patient's cardiologist) for an input on further management - as per cardiology this patient has a tendency to bleed very easily for which reason aspirin was discontinued while following on an outpatient basis. The reason why patient is not on anticoagulation is partially due to risk of bleed but also because she has refused to be on any blood thinner. However as per cardiology should the patient have pulmonary embolism we should consider anticoagulating her. As per cardiology we can continue lasix 20 mg IV BID to ensure diuresis. She had an ECHO in 07/2011 which showed normal EF. - we will continue low dose metoprolol 25 mg BID - V/Q scan to rule out pulmonary embolism  Active Problems:   CHF (CONGESTIVE HEART FAILURE) - management as above - ensure adequate diuresis with lasix 20 mg  - metoprolol 25 mg BID   ATRIAL FIBRILLATION - not on anticoagulation due to tendency to bleed and also because patient has refused it in past - we will start anticoagulation if patient has pulmonary embolism   HTN (HYPERTENSION) - continue Hctz 25 mg daily - continue metoprolol and lasix   HYPOTHYROIDISM - continue levothyroxine 50 mcg daily   ACUTE ON CHRONIC KIDNEY DISEASE, STAGE 3 - creatinine at baseline - monitor on daily basis specially since patient will be started on lasix 20 mg IV BID   EDUCATION - test results and diagnostic studies were discussed with patient  at the bedside - patient has verbalized the understanding - questions were answered at the bedside and contact information was provided for additional questions or concerns   LOS: 1 day   Ashtian Villacis 10/19/2011, 9:33 AM  [facility]  HOSPITALIST Pager: [phone]

## 2011-10-19 NOTE — Evaluation (Signed)
Occupational Therapy Evaluation Patient Details Name: Christina [name] MRN: [id number] DOB: [birth year] Today's Date: 10/19/2011 EV2 [id number] Problem List:  Patient Active Problem List  Diagnoses  . Shortness of breath  . A-fib  . HTN (hypertension)  . Hypothyroidism  . Acute on chronic kidney disease, stage 3  . CHF (congestive heart failure)    Past Medical History:  Past Medical History  Diagnosis Date  . Back pain   . Thyroid disease   . Atrial fibrillation   . Hypertension   . SBO (small bowel obstruction)   . Hypothyroidism    Past Surgical History:  Past Surgical History  Procedure Date  . Back surgery   . Abdominal hysterectomy     OT Assessment/Plan/Recommendation OT Assessment Clinical Impression Statement: 78 y/o female admitted with SOB, living alone presents with a decline in selfcare, mobility. Skilled OT recommended to maximize I with ADLs to supervision level in prep for d/c to next venue of care. Pt [facility]. OT Recommendation/Assessment: Patient will need skilled OT in the acute care venue OT Problem List: Decreased activity tolerance;Decreased safety awareness;Decreased knowledge of use of DME or AE;Pain Barriers to Discharge: Inaccessible home environment;Decreased caregiver support OT Therapy Diagnosis : Generalized weakness OT Plan OT Frequency: Min 1X/week OT Treatment/Interventions: Self-care/ADL training;Therapeutic activities;DME and/or AE instruction;Patient/family education OT Recommendation Follow Up Recommendations: Skilled nursing facility Equipment Recommended: Defer to next venue Individuals Consulted Consulted and Agree with Results and Recommendations: Patient OT Goals Acute Rehab OT Goals OT Goal Formulation: With patient Time For Goal Achievement: 2 weeks ADL Goals Pt Will Perform Grooming: with supervision;Standing at sink (X 3 tasks to improve standing activity tolerance.) ADL Goal: Grooming - Progress:  Goal set today Pt Will Transfer to Toilet: with supervision;Regular height toilet;Ambulation ADL Goal: Toilet Transfer - Progress: Goal set today Pt Will Perform Toileting - Clothing Manipulation: with supervision;Standing ADL Goal: Toileting - Clothing Manipulation - Progress: Goal set today Pt Will Perform Toileting - Hygiene: with supervision;Sit to stand from 3-in-1/toilet ADL Goal: Toileting - Hygiene - Progress: Goal set today  OT Evaluation Precautions/Restrictions  Precautions Precautions: Fall Prior Functioning Home Living Lives With: Alone Type of Home: House Home Layout: One level Home Access: Ramped entrance Bathroom Shower/Tub: Health visitor: Standard Bathroom Accessibility: Yes How Accessible: Accessible via walker Home Adaptive Equipment: Walker - four wheeled;Walker - rolling;Straight cane;Grab bars in shower Prior Function Level of Independence: Independent with basic ADLs;Requires assistive device for independence;Independent with transfers;Independent with gait;Independent with homemaking with ambulation Driving: No Vocation: Retired (pt retired Engineer, civil (consulting)) ADL ADL Grooming: Performed;Wash/dry hands;Wash/dry face;Other (comment) (Minguard A with RW) Where Assessed - Grooming: Standing at sink Upper Body Bathing: Simulated;Set up Where Assessed - Upper Body Bathing: Sitting, bed;Unsupported Lower Body Bathing: Simulated;Minimal assistance Where Assessed - Lower Body Bathing: Sit to stand from bed Upper Body Dressing: Simulated;Set up Where Assessed - Upper Body Dressing: Sitting, bed;Unsupported Lower Body Dressing: Simulated;Minimal assistance Where Assessed - Lower Body Dressing: Sit to stand from bed Toilet Transfer: Performed;Minimal assistance (VCs to stay inside RW, posture, technique when turning.) Toilet Transfer Method: Ambulating Toilet Transfer Equipment: Regular height toilet;Grab bars Toileting - Clothing Manipulation:  Performed;Minimal assistance Where Assessed - Toileting Clothing Manipulation: Sit to stand from 3-in-1 or toilet Toileting - Hygiene: Performed;Minimal assistance Where Assessed - Toileting Hygiene: Sit to stand from 3-in-1 or toilet Tub/Shower Transfer: Not assessed Tub/Shower Transfer Method: Not assessed Equipment Used: Rolling walker Ambulation Related to ADLs: Pt fatigues quickly, increased time  needed throughout session. Vision/Perception    Cognition Cognition Arousal/Alertness: Awake/alert Overall Cognitive Status: Appears within functional limits for tasks assessed Cognition - Other Comments: Pt reports difficulty with word finding & racing thoughts. RN made aware. Sensation/Coordination   Extremity Assessment RUE Assessment RUE Assessment: Within Functional Limits LUE Assessment LUE Assessment: Within Functional Limits Mobility  Bed Mobility Bed Mobility: Yes Supine to Sit: 5: Supervision;With rails;HOB flat Transfers Transfers: Yes Sit to Stand: 4: Min assist;From elevated surface;From toilet;With upper extremity assist Sit to Stand Details (indicate cue type and reason): VCs for hand placement, posture and technique Stand to Sit: 4: Min assist;To chair/3-in-1;To toilet Stand to Sit Details: VCs for hand placement, posture and technique Exercises   End of Session OT - End of Session Equipment Utilized During Treatment: Other (comment) (RW) Activity Tolerance: Patient tolerated treatment well Patient left: in chair;with call bell in reach General Behavior During Session: [facility] for tasks performed Cognition: [facility] for tasks performed   Wisdom Rickey A, OTR/L [phone] 10/19/2011, 2:54 PM

## 2011-10-20 LAB — CARDIAC PANEL(CRET KIN+CKTOT+MB+TROPI)
CK, MB: 5.6 ng/mL — ABNORMAL HIGH (ref 0.3–4.0)
Relative Index: 4.6 — ABNORMAL HIGH (ref 0.0–2.5)
Total CK: 122 U/L (ref 7–177)
Troponin I: <0.3 ng/mL (ref <0.30)

## 2011-10-20 LAB — BASIC METABOLIC PANEL
BUN: 33 mg/dL — ABNORMAL HIGH (ref 6–23)
Chloride: 94 mEq/L — ABNORMAL LOW (ref 96–112)
GFR calc Af Amer: 43 mL/min — ABNORMAL LOW (ref >90)
GFR calc non Af Amer: 37 mL/min — ABNORMAL LOW (ref >90)
Potassium: 3.7 mEq/L (ref 3.5–5.1)
Sodium: 135 mEq/L (ref 135–145)

## 2011-10-20 LAB — CBC
HCT: 31.2 % — ABNORMAL LOW (ref 36.0–46.0)
MCHC: 33.3 g/dL (ref 30.0–36.0)
Platelets: 160 10*3/uL (ref 150–400)
RDW: 15.4 % (ref 11.5–15.5)
WBC: 8.2 10*3/uL (ref 4.0–10.5)

## 2011-10-20 MED ORDER — FUROSEMIDE 20 MG PO TABS
20.0000 mg | ORAL_TABLET | Freq: Every day | ORAL | Status: DC
  Administered 2011-10-20 – 2011-10-21 (×2): 20 mg via ORAL
  Filled 2011-10-20 (×2): qty 1

## 2011-10-20 NOTE — Progress Notes (Signed)
Pt refusing heparin, purpose and importance explained, pt still refusing. mk

## 2011-10-20 NOTE — Progress Notes (Signed)
Patient ID: Christina [name], female   DOB: [birth year], 78 y.o.   MRN: [id number] Subjective:  No events overnight. Patient denies chest pain, shortness of breath, abdominal pain.   Objective:  Vital signs in last 24 hours:  Filed Vitals:   10/19/11 2300 10/20/11 0628 10/20/11 0649 10/20/11 1508  BP: 149/81 121/63 144/86 113/73  Pulse: 82 83 73 65  Temp: 99.6 F (37.6 C) 98.4 F (36.9 C) 98.9 F (37.2 C) 98.2 F (36.8 C)  TempSrc: Oral Oral Oral Oral  Resp: 20 20 20 20   Height:      Weight:      SpO2: 99% 98% 98% 91%    Intake/Output from previous day:   Intake/Output Summary (Last 24 hours) at 10/20/11 1658 Last data filed at 10/20/11 1509  Gross per 24 hour  Intake    720 ml  Output   2750 ml  Net  -2030 ml    Physical Exam: General: Alert, awake, oriented x3, in no acute distress. HEENT: No bruits, no goiter. Moist mucous membranes, no scleral icterus, no conjunctival pallor. Heart: irregular rhythm, rate controlled, S1/S2 +, no murmurs, rubs, gallops. Lungs: Clear to auscultation bilaterally. No wheezing, no rhonchi, no rales.  Abdomen: Soft, nontender, nondistended, positive bowel sounds. Extremities: No clubbing or cyanosis, no pitting edema,  positive pedal pulses. Neuro: Grossly nonfocal.  Lab Results:  Basic Metabolic Panel:    Component Value Date/Time   NA 135 10/20/2011 0445   K 3.7 10/20/2011 0445   CL 94* 10/20/2011 0445   CO2 31 10/20/2011 0445   BUN 33* 10/20/2011 0445   CREATININE 1.29* 10/20/2011 0445   GLUCOSE 97 10/20/2011 0445   CALCIUM 9.1 10/20/2011 0445   CBC:    Component Value Date/Time   WBC 8.2 10/20/2011 0445   HGB 10.4* 10/20/2011 0445   HCT 31.2* 10/20/2011 0445   PLT 160 10/20/2011 0445   MCV 90.4 10/20/2011 0445   NEUTROABS 7.8* 09/28/2010 1938   LYMPHSABS 2.0 09/28/2010 1938   MONOABS 0.8 09/28/2010 1938   EOSABS 0.1 09/28/2010 1938   BASOSABS 0.1 09/28/2010 1938      Lab 10/20/11 0445 10/18/11 1630 10/18/11 1610  WBC 8.2 -- 6.1  HGB  10.4* 9.9* 9.8*  HCT 31.2* 29.0* 29.5*  PLT 160 -- 137*  MCV 90.4 -- 91.9  MCH 30.1 -- 30.5  MCHC 33.3 -- 33.2  RDW 15.4 -- 15.5  LYMPHSABS -- -- --  MONOABS -- -- --  EOSABS -- -- --  BASOSABS -- -- --  BANDABS -- -- --    Lab 10/20/11 0445 10/19/11 0222 10/18/11 1630  NA 135 137 137  K 3.7 3.1* 3.2*  CL 94* 96 100  CO2 31 30 --  GLUCOSE 97 88 109*  BUN 33* 33* 40*  CREATININE 1.29* 1.22* 1.20*  CALCIUM 9.1 8.6 --  MG -- -- --   No results found for this basename: INR:5,PROTIME:5 in the last 168 hours Cardiac markers:  Lab 10/20/11 0030 10/19/11 1113 10/19/11 0230  CKMB 5.6* 5.6* 4.5*  TROPONINI <0.30 <0.30 <0.30  MYOGLOBIN -- -- --   No components found with this basename: POCBNP:3 No results found for this or any previous visit (from the past 240 hour(s)).  Studies/Results: Nm Pulmonary Per & Vent  10/19/2011  *RADIOLOGY REPORT*  Clinical Data: Shortness of breath, yesterday's chest film shows no acute disease.  NM PULMONARY VENTILATION AND PERFUSION SCAN  Comparison:  none  Findings: Ventilation imaging with 9. Xe-133 inhaled  shows homogeneous distribution throughout both lungs with no significant retention on the washout images. Perfusion imaging with 5. Tc75m MAA IV shows a somewhat heterogeneous distribution throughout the lungs with no discrete segmental or subsegmental perfusion defects.  IMPRESSION 1.  Low likelihood ratio for pulmonary embolism.  Original Report Authenticated By: Osa Craver, M.D.    Medications: Scheduled Meds:   . budesonide  3 mg Oral Daily  . donepezil  10 mg Oral Daily  . furosemide  20 mg Intravenous BID  . heparin  5,000 Units Subcutaneous Q8H  . hydrochlorothiazide  25 mg Oral Daily  . levothyroxine  50 mcg Oral Daily  . memantine  10 mg Oral BID  . metoprolol tartrate  25 mg Oral BID  . potassium chloride  40 mEq Oral BID  . sodium chloride  3 mL Intravenous Q12H  . traMADol  50 mg Oral BID  . traZODone  50  mg Oral QHS  . vitamin B-12  1,000 mcg Oral Daily  . DISCONTD: potassium chloride  40 mEq Oral BID   Continuous Infusions:  PRN Meds:.sodium chloride, acetaminophen, lidocaine, morphine injection, sodium chloride  Assessment/Plan:   Principal Problem:   *SHORTNESS OF BREATH  - likely secondary to CHF exacerbation in the setting of elevated BNP  - change lasix IV to PO regimen - follow up 2 D ECHO results  - the 2 sets of cardiac enzymes are negative so far  - daily weight  - strict I&Os  - monitor electrolytes, keep Magnesium level above 2 and potassium level above 4    (called Dr Jacinto Halim (patient's cardiologist) for an input on further management  - as per cardiology this patient has a tendency to bleed very easily for which reason aspirin was discontinued while following on an outpatient basis. The reason why patient is not on anticoagulation is partially due to risk of bleed but also because she has refused to be on any blood thinner. However as per cardiology should the patient have pulmonary embolism we should consider anticoagulating her. As per cardiology we can continue lasix 20 mg IV BID to ensure diuresis. She had an ECHO in 07/2011 which showed normal EF)  - we will continue low dose metoprolol 25 mg BID  - V/Q scan  ruled out pulmonary embolism   Active Problems:   CHF (CONGESTIVE HEART FAILURE)  - management as above  - ensure adequate diuresis with lasix 20 mg  - metoprolol 25 mg BID   ATRIAL FIBRILLATION  - not on anticoagulation due to tendency to bleed and also because patient has refused it in past   HTN (HYPERTENSION)  - continue Hctz 25 mg daily  - continue metoprolol and lasix   HYPOTHYROIDISM  - continue levothyroxine 50 mcg daily   ACUTE ON CHRONIC KIDNEY DISEASE, STAGE 3  - creatinine at baseline  - monitor on daily basis specially since patient will be started on lasix 20 mg IV BID   EDUCATION  - test results and diagnostic studies were discussed  with patient at the bedside  - patient has verbalized the understanding  - questions were answered at the bedside and contact information was provided for additional questions or concerns      LOS: 2 days   Alcide Memoli 10/20/2011, 4:58 PM  [facility] HOSPITALIST Pager: [phone]

## 2011-10-20 NOTE — Evaluation (Signed)
  Clinical/Bedside Swallow Evaluation Patient Details  Name: Christina [name] MRN: [id number] Date of Birth: [birth year]  Today's Date: 10/20/2011 Time:1045  - 1127    Past Medical History:  Past Medical History  Diagnosis Date  . Back pain   . Thyroid disease   . Atrial fibrillation   . Hypertension   . SBO (small bowel obstruction)   . Hypothyroidism    Past Surgical History:  Past Surgical History  Procedure Date  . Back surgery   . Abdominal hysterectomy      General  Date of Onset: 10/20/11 HPI: 78 yo female adm to [facility] with sob, afib, HTN, CKD 3, CHF.  PMH + for SBO, afib, thyroid problem, HTN, backpain..  Pt CXR indicated bronchitic changes-no evidence of pna.  Pt had a VQ scan 10/19/11 indicating low likelihood of PE.  Pt reported pill dysphagia to MD and bedside swallow eval was ordered.  Pt takes pill with yogurt and has for some time.  Pt had a cervical spine DG completed 10/10/06 showing end plate spurring at C5-C6, C6-C7.  Pt acknowledges h/o problems swallowing pills over the last few weeks, but taking medicine with yogurt for a "long time".   Type of Study: Bedside swallow evaluation Diet Prior to this Study: Regular;Thin liquids Temperature Spikes Noted: No Respiratory Status: Room air Behavior/Cognition: Alert Oral Cavity - Dentition: Dentures, top;Adequate natural dentition Vision: Functional for self-feeding Patient Positioning: Upright in bed Baseline Vocal Quality: Clear Volitional Cough: Strong Volitional Swallow: Able to elicit  Oral Motor/Sensory Function  Overall Oral Motor/Sensory Function: Appears within functional limits for tasks assessed  Consistency Results  Ice Chips Ice chips: Not tested  Thin Liquid Thin Liquid: Within functional limits Presentation: Cup;Self Fed  Nectar Thick Liquid Nectar Thick Liquid: Not tested  Honey Thick Liquid Honey Thick Liquid: Not tested  Puree Puree: Not tested Other Comments: pt declined to take  applesauce  SLP Goals  No further SLP needed  Assessment/Plan  Patient Active Problem List  Diagnoses  . Shortness of breath  . A-fib  . HTN (hypertension)  . Hypothyroidism  . Acute on chronic kidney disease, stage 3  . CHF (congestive heart failure)      SLP Assessment/Plan Clinical Impression Statement: Pt appears with functional oropharyngeal swallow without s/s of aspiration.  Pt with timely swallow and clear voice t/o eval.  SLP suspects pt's longterm use of yogurt consumption for po medicine is compensatory for chronic pill dysphagia and if endplate spurring at c5-c6, c6-c7 could contribute to this issue.  Pt denies weight loss or pulmonary infections and therefore she appears with functional swallow.  Pt advised to monitor for progression of worsening and speak to MD about further eval as needed as well as alternative ways to take medications.  Pt denied problems swallowing food or liquids.Donavan Burnet, MS [facility] SLP [phone]

## 2011-10-20 NOTE — Clinical Documentation Improvement (Signed)
CHF DOCUMENTATION CLARIFICATION QUERY  THIS DOCUMENT IS NOT A PERMANENT PART OF THE MEDICAL RECORD  TO RESPOND TO THE THIS QUERY, FOLLOW THE INSTRUCTIONS BELOW:  1. If needed, update documentation for the patient's encounter via the notes activity.  2. Access this query again and click edit on the In [employer].  3. After updating, or not, click F2 to complete all highlighted (required) fields concerning your review. Select "additional documentation in the medical record" OR "no additional documentation provided".  4. Click Sign note button.  5. The deficiency will fall out of your In Basket *Please let us know if you are not able to complete this workflow by phone or e-mail (listed below).  Please update your documentation within the medical record to reflect your response to this query.                                                                                    10/20/11  Dear Dr. [facility],  In a better effort to capture your patient's severity of illness, reflect appropriate length of stay and utilization of resources, a review of the patient medical record has revealed the following indicators the diagnosis of Heart Failure.    Based on your clinical judgment, please clarify and document in a progress note and/or discharge summary the clinical condition associated with the following supporting information:  In responding to this query please exercise your independent judgment.  The fact that a query is asked, does not imply that any particular answer is desired or expected.  Please clarify the the acuity and type of CHF and document in pn and d/c summary. Best Practice: Note the acuity and type of CHF for every patient admission Possible Clinical Conditions?   Systolic Congestive Heart Failure Diastolic Congestive Heart Failure Systolic & Diastolic Congestive Heart Failure Chronic Systolic Congestive Heart Failure Chronic Diastolic Congestive Heart  Failure Chronic Systolic & Diastolic Congestive Heart Failure Acute Systolic Congestive Heart Failure Acute Diastolic Congestive Heart Failure Acute Systolic & Diastolic Congestive Heart Failure Acute on Chronic Systolic Congestive Heart Failure Acute on Chronic Diastolic Congestive Heart Failure Acute on Chronic Systolic & Diastolic  Congestive Heart Failure Other Condition________________________________________ Cannot Clinically Determine  Supporting Information: Risk Factors:HTN, CHF exacerbation, A. Fib  Signs & Symptoms peripheral edema, Shortness of breath. Atrial fib  Lab: BNP: 3053 (10/18/11 )  Echo results: 10/19/11  EF: 45% to 50%  EKG: 10/20/11 A. Fib  Radiology:CHEST - 2 VIEW Bronchitic changes. Treatment: Meds: Lasix  beta blockers, HCTZ,  Low sodium diet, I & O, Monitoring   Reviewed: no additional documentation provided   Thank You,  Andy Gauss RN  Clinical Documentation Specialist:  Pager [phone] E-mail [email or web address]   Health Information Management [place]

## 2011-10-20 NOTE — Clinical Documentation Improvement (Signed)
RENAL FAILURE DOCUMENTATION CLARIFICATION QUERY  THIS DOCUMENT IS NOT A PERMANENT PART OF THE MEDICAL RECORD  TO RESPOND TO THE THIS QUERY, FOLLOW THE INSTRUCTIONS BELOW:  1. If needed, update documentation for the patient's encounter via the notes activity.  2. Access this query again and click edit on the In [employer].  3. After updating, or not, click F2 to complete all highlighted (required) fields concerning your review. Select "additional documentation in the medical record" OR "no additional documentation provided".  4. Click Sign note button.  5. The deficiency will fall out of your In Basket *Please let us know if you are not able to complete this workflow by phone or e-mail (listed below).  Please update your documentation within the medical record to reflect your response to this query.                                                                                    10/20/11  Dear Dr. Elisabeth Pigeon,   In a better effort to capture your patient's severity of illness, reflect appropriate length of stay and utilization of resources, a review of the patient medical record has revealed the following indicators.    Based on your clinical judgment, please clarify and document in a progress note and/or discharge summary the clinical condition associated with the following supporting information:  In responding to this query please exercise your independent judgment.  The fact that a query is asked, does not imply that any particular answer is desired or expected.  Please clarify/validate  on progress note and d/c summary if  active problem listed as " ACUTE ON CHRONIC KIDNEY DISEASE, STAGE 3" can be further listed as one of the diagnosis below. Thank you for your exceptional documentation.    Possible Clinical Conditions?  _______Acute Renal Failure _______Acute Kidney Injury   _______Other Condition_____________ _______Cannot Clinically Determine     Supporting  Information: Risk Factors: HTN, CHF   Signs and Symptoms:  Shortness of breath, Lower ext edema -Lab: -Admission  Cr Level : 10/18/11 results 1.20  -Current Creatinine Level (trend):10/20/11 results 1.29  -Admission  BUN Level :      10/18/11 results 40  -Current BUN Level (trend): 10/20/11 results33  -GFR: 10/19/11 results 40          10/20/11 results 37  Treatments: Monitoring Lasix and beta blockers. low sodium diet daily weights -I and O's    You may use possible, probable, or suspect with inpatient documentation. possible, probable, suspected diagnoses MUST be documented at the time of discharge  Reviewed: {I have reviewed the recommendation and I stand by the diagnosis of acute on chronic kidney disease stage 3. No additional change made in the note.  Thank You,  Andy Gauss RN  Clinical Documentation Specialist:  Pager [phone] E-mail [email or web address]  Health Information Management [place]

## 2011-10-20 NOTE — Progress Notes (Signed)
CSW spoke with patient re: discharge plans. Noted PT recommending [facility] SN[facility]. Patient states she had been to [facility] in the past & would like to either return there or go to [place]. CSW completed FL2 and faxed information out to both facilities. CSW will follow-up in the am re: bed availability. Anticipating discharge tomorrow. CSW called & spoke with patient's daughter, Christina [name] (cell#: [phone] home#: [phone]) to inform her as well.   [name], LCSWA [phone]

## 2011-10-21 LAB — CBC
MCH: 30.4 pg (ref 26.0–34.0)
MCHC: 33.3 g/dL (ref 30.0–36.0)
MCV: 91.1 fL (ref 78.0–100.0)
Platelets: 177 10*3/uL (ref 150–400)
RBC: 3.69 MIL/uL — ABNORMAL LOW (ref 3.87–5.11)
RDW: 15.4 % (ref 11.5–15.5)

## 2011-10-21 LAB — BASIC METABOLIC PANEL
CO2: 28 mEq/L (ref 19–32)
Calcium: 9.5 mg/dL (ref 8.4–10.5)
Creatinine, Ser: 1.37 mg/dL — ABNORMAL HIGH (ref 0.50–1.10)
GFR calc non Af Amer: 34 mL/min — ABNORMAL LOW (ref >90)

## 2011-10-21 MED ORDER — LIDOCAINE 5 % EX PTCH: 2.0000 | MEDICATED_PATCH | Freq: Every day | CUTANEOUS | Status: DC | PRN

## 2011-10-21 NOTE — Progress Notes (Signed)
Patient set to discharge to [facility] today. Daughter, Marylu Lund made aware. PTAR called for transportation.   [name], LCSWA [phone]

## 2011-10-21 NOTE — Progress Notes (Signed)
Md notified that the pt's telemetry monitor showed a 2.03 second pause at 0417 am.  Pt known to have a hx of Afib. She is resting comfortably and is asymptomatic. No new orders at this time, will continue to monitor pt.

## 2011-10-21 NOTE — Discharge Summary (Signed)
Patient ID: Christina [name] MRN: [id number] DOB/AGE: 12-25-1924 78 y.o.  Admit date: 10/18/2011 Discharge date: 10/21/2011  Primary Care Physician:  No primary provider on file.  Discharge Diagnoses:    Present on Admission:  .Shortness of breath .A-fib .HTN (hypertension) .Hypothyroidism .Acute on chronic kidney disease, stage 3 .CHF (congestive heart failure)  Principal Problem:  *Shortness of breath Active Problems:  CHF (congestive heart failure)  A-fib  HTN (hypertension)  Hypothyroidism  Acute on chronic kidney disease, stage 3   Medication List  As of 10/21/2011  1:44 PM   TAKE these medications         acetaminophen 325 MG tablet   Commonly known as: TYLENOL   Take 650 mg by mouth 3 (three) times daily.      budesonide 3 MG 24 hr capsule   Commonly known as: ENTOCORT EC   Take 3 mg by mouth every morning.      donepezil 10 MG tablet   Commonly known as: ARICEPT   Take 10 mg by mouth daily.      furosemide 20 MG tablet   Commonly known as: LASIX   Take 20 mg by mouth daily as needed. swelling      hydrochlorothiazide 25 MG tablet   Commonly known as: HYDRODIURIL   Take 25 mg by mouth daily.      levothyroxine 50 MCG tablet   Commonly known as: SYNTHROID, LEVOTHROID   Take 50 mcg by mouth daily.      lidocaine 5 %   Commonly known as: LIDODERM   Place 2 patches onto the skin daily as needed. Remove & Discard patch within 12 hours or as directed by MD      magnesium oxide 400 MG tablet   Commonly known as: MAG-OX   Take 400 mg by mouth daily.      memantine 10 MG tablet   Commonly known as: NAMENDA   Take 10 mg by mouth 2 (two) times daily.      MYRBETRIQ 25 MG Tb24   Generic drug: Mirabegron ER   Take 1 tablet by mouth daily.      naproxen sodium 220 MG tablet   Commonly known as: ANAPROX   Take 220 mg by mouth daily as needed. pain      PROBIOTIC FORMULA PO   Take 1 capsule by mouth daily.      traMADol 50 MG tablet   Commonly known  as: ULTRAM   Take 50 mg by mouth 2 (two) times daily.      traZODone 50 MG tablet   Commonly known as: DESYREL   Take 50 mg by mouth at bedtime.      vitamin B-12 1000 MCG tablet   Commonly known as: CYANOCOBALAMIN   Take 1,000 mcg by mouth daily.      Vitamin D 1000 UNITS capsule   Take 1,000 Units by mouth once a week. On monday            Disposition and Follow-up:  - follow up with PCP in 1 week  Consults:   1. Physical therapy  Significant Diagnostic Studies:  Dg Chest 2 View  10/18/2011  *RADIOLOGY REPORT*  Clinical Data: Shortness of breath.  SOB.  CHEST - 2 VIEW  Comparison: 09/28/2010  Findings: Heart size is normal.  No pleural effusion or pulmonary edema.  Bronchitic changes noted bilaterally.  No superimposed airspace consolidation identified.  IMPRESSION:  1.  Bronchitic changes. 2.  No evidence for pneumonia.  Original Report Authenticated By: Rosealee Albee, M.D.   Nm Pulmonary Per & Vent  10/19/2011  *RADIOLOGY REPORT*  Clinical Data: Shortness of breath, yesterday's chest film shows no acute disease.  NM PULMONARY VENTILATION AND PERFUSION SCAN  Comparison:  none  Findings: Ventilation imaging with 9. Xe-133 inhaled shows homogeneous distribution throughout both lungs with no significant retention on the washout images. Perfusion imaging with 5. Tc54m MAA IV shows a somewhat heterogeneous distribution throughout the lungs with no discrete segmental or subsegmental perfusion defects.  IMPRESSION 1.  Low likelihood ratio for pulmonary embolism.  Original Report Authenticated By: Osa Craver, M.D.    Brief H and P: 78 y/o with h/o atrial fibrillation, htn, chronic back pain. She is presenting to the ED c/o shortness of breath. Pt recently went on a cruise to [place] and mentions that she stayed active and denies any prolonged trips (airplane trip was 2 hours). She mentions that she usually takes hctz but forgot to take it during her trip. Daughter  reports that when she initially saw her mother 30 minutes after the trip she looked more swollen and was complaining of difficulty breathing. Thus she was sent to the ED for further evaluation.   Physical Exam on Discharge:  Filed Vitals:   10/20/11 1508 10/20/11 2100 10/20/11 2104 10/21/11 0555  BP: 113/73 152/85 152/85 137/73  Pulse: 65 69 69 63  Temp: 98.2 F (36.8 C) 98.2 F (36.8 C)  97.8 F (36.6 C)  TempSrc: Oral Oral  Oral  Resp: 20 20  16   Height:      Weight:      SpO2: 91% 93%  95%     Intake/Output Summary (Last 24 hours) at 10/21/11 1344 Last data filed at 10/21/11 0900  Gross per 24 hour  Intake    960 ml  Output   1800 ml  Net   -840 ml    General: Alert, awake, oriented x3, in no acute distress. HEENT: No bruits, no goiter. Heart: Regular rate and rhythm, without murmurs, rubs, gallops. Lungs: Clear to auscultation bilaterally. Abdomen: Soft, nontender, nondistended, positive bowel sounds. Extremities: No clubbing cyanosis or edema with positive pedal pulses. Neuro: Grossly intact, nonfocal.  CBC:    Component Value Date/Time   WBC 8.1 10/21/2011 0520   HGB 11.2* 10/21/2011 0520   HCT 33.6* 10/21/2011 0520   PLT 177 10/21/2011 0520   MCV 91.1 10/21/2011 0520   NEUTROABS 7.8* 09/28/2010 1938   LYMPHSABS 2.0 09/28/2010 1938   MONOABS 0.8 09/28/2010 1938   EOSABS 0.1 09/28/2010 1938   BASOSABS 0.1 09/28/2010 1938    Basic Metabolic Panel:    Component Value Date/Time   NA 135 10/21/2011 0520   K 4.2 10/21/2011 0520   CL 94* 10/21/2011 0520   CO2 28 10/21/2011 0520   BUN 39* 10/21/2011 0520   CREATININE 1.37* 10/21/2011 0520   GLUCOSE 102* 10/21/2011 0520   CALCIUM 9.5 10/21/2011 0520    Assessment/Plan:   Principal Problem:   *SHORTNESS OF BREATH  - likely secondary to CHF exacerbation in the setting of elevated BNP  - change lasix IV to PO regimen  - follow up 2 D ECHO results - EF 45-50% - the 2 sets of cardiac enzymes are negative   (called Dr Jacinto Halim  (patient's cardiologist) for an input on further management  - as per cardiology this patient has a tendency to bleed very easily for which reason aspirin was discontinued while following on an  outpatient basis. The reason why patient is not on anticoagulation is partially due to risk of bleed but also because she has refused to be on any blood thinner. However as per cardiology should the patient have pulmonary embolism we should consider anticoagulating her. As per cardiology we can continue lasix 20 mg IV BID to ensure diuresis. She had an ECHO in 07/2011 which showed normal EF)  - we will continue low dose metoprolol 25 mg BID  - V/Q scan ruled out pulmonary embolism   Active Problems:   CHF (CONGESTIVE HEART FAILURE)  - management as above  - ensure adequate diuresis with lasix 20 mg  - metoprolol 25 mg BID   ATRIAL FIBRILLATION  - not on anticoagulation due to tendency to bleed and also because patient has refused it in past   HTN (HYPERTENSION)  - continue Hctz 25 mg daily  - continue metoprolol and lasix   HYPOTHYROIDISM  - continue levothyroxine 50 mcg daily   ACUTE ON CHRONIC KIDNEY DISEASE, STAGE 3  - creatinine at baseline   EDUCATION  - test results and diagnostic studies were discussed with patient at the bedside  - patient has verbalized the understanding  - questions were answered at the bedside and contact information was provided for additional questions or concerns      Time spent on Discharge: Greater than 30 minutes  Signed: Kyrian Stage 10/21/2011, 1:44 PM

## 2011-10-21 NOTE — Progress Notes (Signed)
Physical Therapy Treatment Patient Details Name: Christina [name] MRN: [id number] DOB: 06-24-1925 Today's Date: 10/21/2011  11:45-12:10 2G PT Assessment/Plan  PT - Assessment/Plan Comments on Treatment Session: Pt pleasant and motivated to progress with mobility. Plan is to DC to [facility]. Gait distance limited by fatigue and chronic LBP. PT Plan: Discharge plan remains appropriate PT Frequency: Min 3X/week Follow Up Recommendations: Skilled nursing facility Equipment Recommended: Defer to next venue PT Goals  Acute Rehab PT Goals PT Goal Formulation: With patient Time For Goal Achievement: 2 weeks Pt will go Supine/Side to Sit: with modified independence PT Goal: Supine/Side to Sit - Progress: Progressing toward goal Pt will go Sit to Supine/Side: with modified independence Pt will go Sit to Stand: with supervision PT Goal: Sit to Stand - Progress: Progressing toward goal Pt will go Stand to Sit: with supervision PT Goal: Stand to Sit - Progress: Progressing toward goal Pt will Ambulate: 51 - 150 feet;with supervision PT Goal: Ambulate - Progress: Progressing toward goal Pt will Perform Home Exercise Program: with supervision, verbal cues required/provided  PT Treatment Precautions/Restrictions  Precautions Precautions: Fall Restrictions Weight Bearing Restrictions: No Mobility (including Balance) Bed Mobility Bed Mobility: Yes Supine to Sit: 6: Modified independent (Device/Increase time);With rails;HOB flat Transfers Transfers: Yes Sit to Stand: From bed;With upper extremity assist;From elevated surface;4: Min assist Sit to Stand Details (indicate cue type and reason): supervision for safety, increased time Stand to Sit: 4: Min assist;With upper extremity assist;To chair/3-in-1 Stand to Sit Details: pt 85%, assist to control descent Ambulation/Gait Ambulation/Gait: Yes Ambulation/Gait Assistance: 5: Supervision Ambulation/Gait Assistance Details (indicate cue type  and reason): VCs for flexed posture Ambulation Distance (Feet): 130 Feet (65x2 with one seated rest break of 5' due to back pain) Assistive device: Rolling walker Gait Pattern: Step-through pattern;Trunk flexed    Exercise    End of Session PT - End of Session Equipment Utilized During Treatment: Gait belt Activity Tolerance: Patient limited by fatigue Patient left: in chair;with call bell in reach Nurse Communication: Mobility status for transfers;Mobility status for ambulation General Behavior During Session: [facility] for tasks performed Cognition: [facility] for tasks performed  Tamala Ser 10/21/2011, 1:09 PM Tamala Ser PT 10/21/2011  [phone]

## 2011-11-26 ENCOUNTER — Other Ambulatory Visit: Payer: Self-pay | Admitting: Neurology

## 2011-11-26 DIAGNOSIS — G9341 Metabolic encephalopathy: Secondary | ICD-10-CM

## 2011-12-01 ENCOUNTER — Other Ambulatory Visit: Payer: Medicare Other

## 2012-01-10 ENCOUNTER — Emergency Department (HOSPITAL_COMMUNITY): Payer: Medicare Other

## 2012-01-10 ENCOUNTER — Encounter (HOSPITAL_COMMUNITY): Payer: Self-pay

## 2012-01-10 ENCOUNTER — Emergency Department (HOSPITAL_COMMUNITY)
Admission: EM | Admit: 2012-01-10 | Discharge: 2012-01-11 | Disposition: A | Discharge status: Skilled Nursing Facility | Payer: Medicare Other | Attending: Emergency Medicine | Admitting: Emergency Medicine

## 2012-01-10 DIAGNOSIS — R300 Dysuria: Secondary | ICD-10-CM | POA: Insufficient documentation

## 2012-01-10 DIAGNOSIS — R109 Unspecified abdominal pain: Secondary | ICD-10-CM | POA: Insufficient documentation

## 2012-01-10 DIAGNOSIS — N183 Chronic kidney disease, stage 3 unspecified: Secondary | ICD-10-CM | POA: Insufficient documentation

## 2012-01-10 DIAGNOSIS — N189 Chronic kidney disease, unspecified: Secondary | ICD-10-CM

## 2012-01-10 DIAGNOSIS — I129 Hypertensive chronic kidney disease with stage 1 through stage 4 chronic kidney disease, or unspecified chronic kidney disease: Secondary | ICD-10-CM | POA: Insufficient documentation

## 2012-01-10 DIAGNOSIS — E079 Disorder of thyroid, unspecified: Secondary | ICD-10-CM | POA: Insufficient documentation

## 2012-01-10 DIAGNOSIS — I509 Heart failure, unspecified: Secondary | ICD-10-CM | POA: Insufficient documentation

## 2012-01-10 DIAGNOSIS — Z79899 Other long term (current) drug therapy: Secondary | ICD-10-CM | POA: Insufficient documentation

## 2012-01-10 HISTORY — DX: Heart failure, unspecified: I50.9

## 2012-01-10 HISTORY — DX: Alcohol abuse, in remission: F10.11

## 2012-01-10 HISTORY — DX: Low back pain, unspecified: M54.50

## 2012-01-10 HISTORY — DX: Low back pain: M54.5

## 2012-01-10 HISTORY — DX: Other chronic pain: G89.29

## 2012-01-10 HISTORY — DX: Chronic kidney disease, stage 3 unspecified: N18.30

## 2012-01-10 HISTORY — DX: Opioid dependence, in remission: F11.21

## 2012-01-10 HISTORY — DX: Chronic kidney disease, stage 3 (moderate): N18.3

## 2012-01-10 LAB — URINALYSIS, ROUTINE W REFLEX MICROSCOPIC
Glucose, UA: NEGATIVE mg/dL
Hgb urine dipstick: NEGATIVE
Leukocytes, UA: NEGATIVE
Protein, ur: NEGATIVE mg/dL
Urobilinogen, UA: 1 mg/dL (ref 0.0–1.0)

## 2012-01-10 LAB — BASIC METABOLIC PANEL
BUN: 37 mg/dL — ABNORMAL HIGH (ref 6–23)
Calcium: 9.4 mg/dL (ref 8.4–10.5)
Creatinine, Ser: 1.68 mg/dL — ABNORMAL HIGH (ref 0.50–1.10)
GFR calc Af Amer: 31 mL/min — ABNORMAL LOW (ref >90)
GFR calc non Af Amer: 27 mL/min — ABNORMAL LOW (ref >90)

## 2012-01-10 LAB — CBC
HCT: 38.9 % (ref 36.0–46.0)
Hemoglobin: 13.5 g/dL (ref 12.0–15.0)
MCH: 31.6 pg (ref 26.0–34.0)
MCHC: 34.7 g/dL (ref 30.0–36.0)
MCV: 91.1 fL (ref 78.0–100.0)
RDW: 14.4 % (ref 11.5–15.5)

## 2012-01-10 LAB — DIFFERENTIAL
Basophils Absolute: 0.1 10*3/uL (ref 0.0–0.1)
Basophils Relative: 1 % (ref 0–1)
Eosinophils Absolute: 0 10*3/uL (ref 0.0–0.7)
Eosinophils Relative: 0 % (ref 0–5)
Monocytes Absolute: 1.1 10*3/uL — ABNORMAL HIGH (ref 0.1–1.0)
Monocytes Relative: 11 % (ref 3–12)
Neutro Abs: 5.4 10*3/uL (ref 1.7–7.7)

## 2012-01-10 LAB — LACTIC ACID, PLASMA: Lactic Acid, Venous: 1.5 mmol/L (ref 0.5–2.2)

## 2012-01-10 MED ORDER — HYDROCODONE-ACETAMINOPHEN 5-325 MG PO TABS
1.0000 | ORAL_TABLET | Freq: Once | ORAL | Status: AC
  Administered 2012-01-10: 1 via ORAL
  Filled 2012-01-10: qty 1

## 2012-01-10 MED ORDER — MORPHINE SULFATE 2 MG/ML IJ SOLN
2.0000 mg | Freq: Once | INTRAMUSCULAR | Status: AC
  Administered 2012-01-10: 2 mg via INTRAMUSCULAR
  Filled 2012-01-10: qty 1

## 2012-01-10 MED ORDER — POTASSIUM CHLORIDE 20 MEQ/15ML (10%) PO LIQD
40.0000 meq | Freq: Once | ORAL | Status: AC
  Administered 2012-01-10: 40 meq via ORAL
  Filled 2012-01-10: qty 30

## 2012-01-10 MED ORDER — HYDROCODONE-ACETAMINOPHEN 5-325 MG PO TABS
ORAL_TABLET | ORAL | Status: AC
Start: 2012-01-10 — End: 2012-01-20

## 2012-01-10 NOTE — ED Notes (Signed)
Blood culture set 1 sent to lab

## 2012-01-10 NOTE — ED Notes (Signed)
Family arrived at bedside.

## 2012-01-10 NOTE — ED Notes (Signed)
ZOX:WRUEA<VW> Expected date:01/10/12<BR> Expected time:<BR> Means of arrival:<BR> Comments:<BR> EMS 80 GC - needs urinary cath

## 2012-01-10 NOTE — Discharge Instructions (Signed)
RESOURCE GUIDE  Dental Problems  Patients with Medicaid: [facility]                     [facility] [address]                                           [address] Phone:  [phone]                                                  Phone:  [phone]  If unable to pay or uninsured, contact:  [facility] or [facility] to become qualified for the adult dental clinic.  Chronic Pain Problems Contact [facility]  [phone] Patients need to be referred by their primary care doctor.  Insufficient Money for Medicine Contact [employer]:  call "211" or [facility] 271-5999.  No Primary Care Doctor Call [facility]  832-8000 Other agencies that provide inexpensive medical care    [facility]  832-8035    [facility]  832-7272    [facility]  [phone]    Women's Clinic  832-4777    [facility]  373-0678    [facility]  272-1050  Psychological Services [facility]  832-9600 [facility]  [phone] [facility]   800 853-5163 (emergency services 641-4993)  Substance Abuse Resources [facility]  336-882-2125 [facility] 336-784-9470 [facility] 336-285-9073 [facility] 336-845-3988 Residential & Outpatient Substance Abuse Program  800-659-3381  Abuse/Neglect [place] Child Abuse Hotline (336) 641-3795 [place] Child Abuse Hotline 800-378-5315 (After Hours)  Emergency Shelter [facility] (336) 271-5985  M[facility] (336) 275-9566 [facility] (704) 372-4663  MRSA Hotline #:   832-7006    [place] Resources  [facility] [place]     [employer]                          [facility] [address] [place]                       [address]      [address]  [place]                                                [place]                            [place] Phone:  [phone]                                   Phone:  [phone]                 Phone:  [phone]  [facility] Phone:  [phone]    [facility] Child Abuse Hotline (224)261-9929 6154698310 (After Hours)   Take the prescription as directed.  Call your regular medical doctor and the Urologist tomorrow morning to schedule a follow up appointment this week.  Return to the Emergency Department immediately sooner if worsening.

## 2012-01-10 NOTE — ED Notes (Signed)
Per EMS, pt from [facility] assisted living.  Pt was to have home health nurse to come place a foley cath in home today.  Home Health did not show.  Pt brought to hospital for catheter.

## 2012-01-10 NOTE — ED Provider Notes (Signed)
History     CSN: [id number]  Arrival date & time 01/10/12  1932   First MD Initiated Contact with Patient 01/10/12 1956      Chief Complaint  Patient presents with  . Urinary Retention    The history is provided by the patient and a relative. History Limited By: poor historian.   Pt was seen at 2005.  Per pt and her family, c/o gradual onset and persistence of constant acute flair of her chronic lower abd "pain," described as "bladder spasms," for the past several years, worse over the past several days.  Pt's daughter states pt is currently "being tx for a UTI again."  Pt's daughter asked PMD if [facility] could have Home Health RN place foley today for possible urinary retention.  Apparently RN did not come to facility, so pt's daughter wanted pt transferred to the ED for foley catheter placement to check for urinary retention.  No fevers, no N/V/D, no back pain, no CP/SOB.    Past Medical History  Diagnosis Date  . Thyroid disease   . Atrial fibrillation   . Hypertension   . SBO (small bowel obstruction)   . Hypothyroidism   . CHF (congestive heart failure)   . Atrial fibrillation   . CKD (chronic kidney disease) stage 3, GFR 30-59 ml/min   . Chronic low back pain   . History of narcotic addiction   . History of alcohol abuse     Past Surgical History  Procedure Date  . Back surgery   . Abdominal hysterectomy   . Rotator cuff repair     History  Substance Use Topics  . Smoking status: Former Games developer  . Smokeless tobacco: Not on file  . Alcohol Use: No     former etoh abuse    Review of Systems  Unable to perform ROS: Other    Allergies  Lisinopril; Lyrica; Macrodantin; and Sudafed  Home Medications   Current Outpatient Rx  Name Route Sig Dispense Refill  . ACETAMINOPHEN 325 MG PO TABS Oral Take 650 mg by mouth 3 (three) times daily.    . BUDESONIDE ER 3 MG PO CP24 Oral Take 9 mg by mouth every evening.     Marland Kitchen CRANBERRY 250 MG PO CAPS  Oral Take 1 capsule by mouth 2 (two) times daily.    Marland Kitchen HYDROCHLOROTHIAZIDE 25 MG PO TABS Oral Take 25 mg by mouth daily.    Marland Kitchen LEVOTHYROXINE SODIUM 50 MCG PO TABS Oral Take 50 mcg by mouth daily.     Marland Kitchen LIDOCAINE 5 % EX PTCH Transdermal Place 2 patches onto the skin daily. Remove & Discard patch within 12 hours or as directed by MD    . PROBIOTIC FORMULA PO Oral Take 1 capsule by mouth daily.      . SERTRALINE HCL 25 MG PO TABS Oral Take 25 mg by mouth daily.    . EUCERIN EX CREA Topical Apply 1 application topically See admin instructions. Applies to arms and legs daily    . TRAMADOL HCL 50 MG PO TABS Oral Take 50 mg by mouth 3 (three) times daily as needed. pain    . TRAZODONE HCL 50 MG PO TABS Oral Take 50 mg by mouth at bedtime.    Marland Kitchen VITAMIN B-12 1000 MCG PO TABS Oral Take 1,000 mcg by mouth daily.        BP 144/52  Pulse 78  Resp 22  SpO2 97%  Physical Exam 2010: Physical examination:  Nursing notes reviewed; Vital signs and O2 SAT reviewed;  Constitutional: Well developed, Well nourished, Well hydrated; Head:  Normocephalic, atraumatic; Eyes: EOMI, PERRL, No scleral icterus; ENMT: Mouth and pharynx normal, Mucous membranes moist; Neck: Supple, Full range of motion, No lymphadenopathy; Cardiovascular: Regular rate and rhythm, No murmur, rub, or gallop; Respiratory: Breath sounds clear & equal bilaterally, No rales, rhonchi, wheezes, or rub, Normal respiratory effort/excursion; Chest: Nontender, Movement normal; Abdomen: Soft, +suprapubic tenderness to palp, Nondistended, Normal bowel sounds; Extremities: Pulses normal, No tenderness, No edema, No calf edema or asymmetry.; Neuro: Awake, alert, poor historian, speech clear, Major CN grossly intact.  No gross focal motor or sensory deficits in extremities.; Skin: Color normal, Warm, Dry   ED Course  Procedures   2015:  Foley placed with small amount of urine return.  No signs of retention at this time.  I recommended to pt's daughter to d/c  the foley, as pt was not having urinary retention and catheter would likely cause or worsen her UTI and bladder spasms.  Pt's daughter agreeable. Pt's daughter is also requesting "a CT scan" to "see if this is something else than bladder spasms."  Will order.   11:36 PM:  Pt appears comfortable after pain meds and foley removal.  Will replete potassium PO.  Pt and daughter would like her to be d/c to return to assisted living facility.  Agreeable to f/u with her PMD tomorrow.  Dx testing d/w pt and family.  Questions answered.  Verb understanding, agreeable to d/c home with outpt f/u.      MDM  MDM Reviewed: nursing note, vitals and previous chart Reviewed previous: labs Interpretation: labs and CT scan   Results for orders placed during the hospital encounter of 01/10/12  URINALYSIS, ROUTINE W REFLEX MICROSCOPIC      Component Value Range   Color, Urine YELLOW  YELLOW    APPearance CLEAR  CLEAR    Specific Gravity, Urine 1.024  1.005 - 1.030    pH 6.5  5.0 - 8.0    Glucose, UA NEGATIVE  NEGATIVE (mg/dL)   Hgb urine dipstick NEGATIVE  NEGATIVE    Bilirubin Urine SMALL (*) NEGATIVE    Ketones, ur TRACE (*) NEGATIVE (mg/dL)   Protein, ur NEGATIVE  NEGATIVE (mg/dL)   Urobilinogen, UA 1.0  0.0 - 1.0 (mg/dL)   Nitrite NEGATIVE  NEGATIVE    Leukocytes, UA NEGATIVE  NEGATIVE   CBC      Component Value Range   WBC 10.0  4.0 - 10.5 (K/uL)   RBC 4.27  3.87 - 5.11 (MIL/uL)   Hemoglobin 13.5  12.0 - 15.0 (g/dL)   HCT 16.1  09.6 - 04.5 (%)   MCV 91.1  78.0 - 100.0 (fL)   MCH 31.6  26.0 - 34.0 (pg)   MCHC 34.7  30.0 - 36.0 (g/dL)   RDW 40.9  81.1 - 91.4 (%)   Platelets 218  150 - 400 (K/uL)  DIFFERENTIAL      Component Value Range   Neutrophils Relative 54  43 - 77 (%)   Neutro Abs 5.4  1.7 - 7.7 (K/uL)   Lymphocytes Relative 34  12 - 46 (%)   Lymphs Abs 3.4  0.7 - 4.0 (K/uL)   Monocytes Relative 11  3 - 12 (%)   Monocytes Absolute 1.1 (*) 0.1 - 1.0 (K/uL)   Eosinophils Relative 0  0  - 5 (%)   Eosinophils Absolute 0.0  0.0 - 0.7 (K/uL)   Basophils  Relative 1  0 - 1 (%)   Basophils Absolute 0.1  0.0 - 0.1 (K/uL)  BASIC METABOLIC PANEL      Component Value Range   Sodium 132 (*) 135 - 145 (mEq/L)   Potassium 3.1 (*) 3.5 - 5.1 (mEq/L)   Chloride 92 (*) 96 - 112 (mEq/L)   CO2 27  19 - 32 (mEq/L)   Glucose, Bld 96  70 - 99 (mg/dL)   BUN 37 (*) 6 - 23 (mg/dL)   Creatinine, Ser 1.61 (*) 0.50 - 1.10 (mg/dL)   Calcium 9.4  8.4 - 09.6 (mg/dL)   GFR calc non Af Amer 27 (*) >90 (mL/min)   GFR calc Af Amer 31 (*) >90 (mL/min)  LACTIC ACID, PLASMA      Component Value Range   Lactic Acid, Venous 1.5  0.5 - 2.2 (mmol/L)  PROCALCITONIN      Component Value Range   Procalcitonin <0.10     Ct Abdomen Pelvis Wo Contrast 01/10/2012  *RADIOLOGY REPORT*  Clinical Data: Abdominal and low pelvic pain, dysuria, back pain  CT ABDOMEN AND PELVIS WITHOUT CONTRAST  Technique:  Multidetector CT imaging of the abdomen and pelvis was performed following the standard protocol without intravenous contrast.  Comparison: 11/07/2009  Findings: Lung bases are clear.  Remote left posterior seventh through eleventh rib fractures noted.  Significant s-shaped scoliosis of the thoracolumbar spine again noted.  Liver, gallbladder, spleen, adrenal glands, and fatty infiltrated pancreas are normal.  Bilateral renal cortical atrophy noted without hydronephrosis or radiopaque renal, ureteral, or bladder calculus. Bladder is decompressed with a Foley catheter in place.  No ascites or lymphadenopathy.  No free air.  Abdominal aorta is ectatic and tortuous without aneurysm.  Uterus presumed surgically absent.  Neither ovary is visualized.  No bowel wall thickening or focal segmental dilatation.  Appendix not visualized but no secondary evidence for acute appendicitis identified. No acute osseous abnormality.  IMPRESSION: No acute intra-abdominal or pelvic pathology.  Original Report Authenticated By: Harrel Lemon,  M.D.    Results for MEEYA, GOLDIN (MRN [id number]) as of 01/10/2012 23:37  Ref. Range 10/18/2011 16:30 10/19/2011 02:22 10/20/2011 04:45 10/21/2011 05:20 01/10/2012 20:26  BUN Latest Range: 6-23 mg/dL 40 (H) 33 (H) 33 (H) 39 (H) 37 (H)  Creat Latest Range: 0.50-1.10 mg/dL 9.14 (H) 7.82 (H) 9.56 (H) 1.37 (H) 1.68 (H)      Results for MECHEL, HAGGARD (MRN [id number]) as of 01/10/2012 23:37  Ref. Range 10/18/2011 16:30 10/20/2011 04:45 10/21/2011 05:20 01/10/2012 20:26  HGB Latest Range: 12.0-15.0 g/dL 9.9 (L) 46.9 (L) 62.9 (L) 13.5  HCT Latest Range: 36.0-46.0 % 29.0 (L) 31.2 (L) 33.6 (L) 38.9       Laray Anger, DO 01/13/12 [phone]

## 2012-01-11 MED ORDER — ONDANSETRON 8 MG PO TBDP
8.0000 mg | ORAL_TABLET | Freq: Once | ORAL | Status: AC
  Administered 2012-01-11: 8 mg via ORAL
  Filled 2012-01-11: qty 1

## 2012-01-12 LAB — URINE CULTURE: Culture  Setup Time: 201304220308

## 2012-05-25 DIAGNOSIS — N183 Chronic kidney disease, stage 3 unspecified: Secondary | ICD-10-CM | POA: Insufficient documentation

## 2012-05-25 DIAGNOSIS — IMO0002 Reserved for concepts with insufficient information to code with codable children: Secondary | ICD-10-CM | POA: Insufficient documentation

## 2012-05-25 DIAGNOSIS — I509 Heart failure, unspecified: Secondary | ICD-10-CM | POA: Insufficient documentation

## 2012-05-25 DIAGNOSIS — E039 Hypothyroidism, unspecified: Secondary | ICD-10-CM | POA: Insufficient documentation

## 2012-05-25 DIAGNOSIS — Y9210 Unspecified residential institution as the place of occurrence of the external cause: Secondary | ICD-10-CM | POA: Insufficient documentation

## 2012-05-25 DIAGNOSIS — M25529 Pain in unspecified elbow: Secondary | ICD-10-CM | POA: Insufficient documentation

## 2012-05-25 DIAGNOSIS — I129 Hypertensive chronic kidney disease with stage 1 through stage 4 chronic kidney disease, or unspecified chronic kidney disease: Secondary | ICD-10-CM | POA: Insufficient documentation

## 2012-05-25 DIAGNOSIS — I4891 Unspecified atrial fibrillation: Secondary | ICD-10-CM | POA: Insufficient documentation

## 2012-05-25 DIAGNOSIS — S0003XA Contusion of scalp, initial encounter: Secondary | ICD-10-CM | POA: Insufficient documentation

## 2012-05-25 DIAGNOSIS — S41109A Unspecified open wound of unspecified upper arm, initial encounter: Secondary | ICD-10-CM | POA: Insufficient documentation

## 2012-05-25 DIAGNOSIS — R296 Repeated falls: Secondary | ICD-10-CM | POA: Insufficient documentation

## 2012-05-26 ENCOUNTER — Emergency Department (HOSPITAL_COMMUNITY): Payer: Medicare Other

## 2012-05-26 ENCOUNTER — Emergency Department (HOSPITAL_COMMUNITY)
Admission: EM | Admit: 2012-05-26 | Discharge: 2012-05-26 | Disposition: A | Discharge status: Skilled Nursing Facility | Payer: Medicare Other | Attending: Emergency Medicine | Admitting: Emergency Medicine

## 2012-05-26 ENCOUNTER — Encounter (HOSPITAL_COMMUNITY): Payer: Self-pay | Admitting: Emergency Medicine

## 2012-05-26 DIAGNOSIS — S41119A Laceration without foreign body of unspecified upper arm, initial encounter: Secondary | ICD-10-CM

## 2012-05-26 DIAGNOSIS — W010XXA Fall on same level from slipping, tripping and stumbling without subsequent striking against object, initial encounter: Secondary | ICD-10-CM

## 2012-05-26 DIAGNOSIS — S0083XA Contusion of other part of head, initial encounter: Secondary | ICD-10-CM

## 2012-05-26 MED ORDER — OXYCODONE-ACETAMINOPHEN 5-325 MG PO TABS
2.0000 | ORAL_TABLET | Freq: Once | ORAL | Status: AC
  Administered 2012-05-26: 2 via ORAL
  Filled 2012-05-26: qty 2

## 2012-05-26 NOTE — ED Notes (Signed)
Bed:WA05<BR> Expected date:<BR> Expected time:<BR> Means of arrival:<BR> Comments:<BR> EMS

## 2012-05-26 NOTE — ED Provider Notes (Signed)
History     CSN: [id number]  Arrival date & time 05/25/12  2358   First MD Initiated Contact with Patient 05/26/12 [phone]      Chief Complaint  Patient presents with  . Fall    (Consider location/radiation/quality/duration/timing/severity/associated sxs/prior treatment) HPI He 78-year-old female presents from nursing home via EMS with reported mechanical fall. Patient reports she lost her balance, and fell injuring her left arm. Patient reports it feels like her left arm is on fire.  Pt struck her head, but denies LOC.  She is unsure of her last tetanus.  Patient is requesting morphine for the pain in her arm. She is demanding removal of her c-collar.  No other reported injuries or complaints at this time.  Past Medical History  Diagnosis Date  . Thyroid disease   . Atrial fibrillation   . Hypertension   . SBO (small bowel obstruction)   . Hypothyroidism   . CHF (congestive heart failure)   . Atrial fibrillation   . CKD (chronic kidney disease) stage 3, GFR 30-59 ml/min   . Chronic low back pain   . History of narcotic addiction   . History of alcohol abuse     Past Surgical History  Procedure Date  . Back surgery   . Abdominal hysterectomy   . Rotator cuff repair     No family history on file.  History  Substance Use Topics  . Smoking status: Former Games developer  . Smokeless tobacco: Not on file  . Alcohol Use: No     former etoh abuse    OB History    Grav Para Term Preterm Abortions TAB SAB Ect Mult Living                  Review of Systems  All other systems reviewed and are negative.    Allergies  Lisinopril; Lyrica; Macrodantin; and Sudafed  Home Medications   Current Outpatient Rx  Name Route Sig Dispense Refill  . ACETAMINOPHEN 500 MG PO TABS Oral Take 500 mg by mouth 3 (three) times daily.    Marland Kitchen CRANBERRY 250 MG PO CAPS Oral Take 1 capsule by mouth 2 (two) times daily.    . FUROSEMIDE 20 MG PO TABS Oral Take 20 mg by mouth daily. In the am as needed  for edema.    Marland Kitchen HYDROCHLOROTHIAZIDE 25 MG PO TABS Oral Take 25 mg by mouth daily.    Marland Kitchen LEVOTHYROXINE SODIUM 50 MCG PO TABS Oral Take 50 mcg by mouth daily.     Marland Kitchen LIDOCAINE 5 % EX PTCH Transdermal Place 2 patches onto the skin daily. Remove & Discard patch within 12 hours or as directed by MD    . MIRABEGRON ER 50 MG PO TB24 Oral Take 50 mg by mouth daily as needed. For bladder spasms    . SACCHAROMYCES BOULARDII 250 MG PO CAPS Oral Take 250 mg by mouth daily.    . SERTRALINE HCL 25 MG PO TABS Oral Take 25 mg by mouth daily.    . EUCERIN EX CREA Topical Apply 1 application topically 2 (two) times daily. Applies to arms and legs daily    . TRAMADOL HCL 50 MG PO TABS Oral Take 50 mg by mouth 2 (two) times daily. pain    . TRAMADOL HCL 50 MG PO TABS Oral Take 50 mg by mouth every 8 (eight) hours as needed. Pain    . TRAZODONE HCL 50 MG PO TABS Oral Take 75 mg by mouth at  bedtime.     Marland Kitchen TRIMETHOPRIM 100 MG PO TABS Oral Take 100 mg by mouth 2 (two) times daily.      BP 151/69  Pulse 74  Temp 98.3 F (36.8 C) (Oral)  Resp 16  SpO2 92%  Physical Exam  Nursing note and vitals reviewed. Constitutional: She is oriented to person, place, and time. She appears well-developed and well-nourished.  HENT:  Head: Normocephalic and atraumatic.  Right Ear: External ear normal.  Left Ear: External ear normal.  Nose: Nose normal.  Mouth/Throat: Oropharynx is clear and moist.       Bruising to the left side of face noted  Eyes: Conjunctivae and EOM are normal. Pupils are equal, round, and reactive to light.  Neck: Normal range of motion. Neck supple. No JVD present. No tracheal deviation present. No thyromegaly present.  Cardiovascular: Normal rate, regular rhythm, normal heart sounds and intact distal pulses.  Exam reveals no gallop and no friction rub.   No murmur heard. Pulmonary/Chest: Effort normal and breath sounds normal. No stridor. No respiratory distress. She has no wheezes. She has no rales.  She exhibits no tenderness.  Abdominal: Soft. Bowel sounds are normal. She exhibits no distension and no mass. There is no tenderness. There is no rebound and no guarding.  Musculoskeletal: Normal range of motion. She exhibits tenderness. She exhibits no edema.       Large skin tear noted to left upper arm from elbow to mid humerus with skin flap missing  Lymphadenopathy:    She has no cervical adenopathy.  Neurological: She is alert and oriented to person, place, and time. No cranial nerve deficit. She exhibits normal muscle tone. Coordination normal.  Skin: Skin is warm and dry. No rash noted. No erythema. No pallor.  Psychiatric: She has a normal mood and affect. Her behavior is normal. Judgment and thought content normal.    ED Course  Procedures (including critical care time)  Labs Reviewed - No data to display Dg Elbow Complete Left  05/26/2012  *RADIOLOGY REPORT*  Clinical Data: Pain, abrasions, fall  LEFT ELBOW - COMPLETE 3+ VIEW  Comparison: None  Findings: Osseous mineralization diffusely decreased. Joint spaces preserved. No acute fracture, dislocation, or bone destruction. Dorsal dressing artifacts. No definite elbow joint effusion or acute osseous findings identified.  IMPRESSION: No acute osseous abnormalities.   Original Report Authenticated By: Lollie Marrow, M.D.    Ct Head Wo Contrast  05/26/2012  *RADIOLOGY REPORT*  Clinical Data:  Fall from standing position  CT HEAD WITHOUT CONTRAST CT CERVICAL SPINE WITHOUT CONTRAST  Technique:  Multidetector CT imaging of the head and cervical spine was performed following the standard protocol without intravenous contrast.  Multiplanar CT image reconstructions of the cervical spine were also generated.  Comparison:  CT head 09/28/2010  CT HEAD  Findings: Generalized atrophy. Normal ventricular morphology. No midline shift or mass effect. Small vessel chronic ischemic changes of deep cerebral white matter. No intracranial hemorrhage, mass  lesion, or acute infarction. Visualized paranasal sinuses and mastoid air cells clear. Bones unremarkable. Asymmetric positioning in gantry. Head rotation to the right. Atherosclerotic calcification of internal carotid arteries at skull base.  IMPRESSION: Atrophy with small vessel chronic ischemic changes of deep cerebral white matter. No acute intracranial abnormalities.  CT CERVICAL SPINE  Findings: Head rotation to the right. Visualized skull base intact. Minimal motion. Artifacts from jewelry. Lung apices clear. Diffuse osseous demineralization. Disc space narrowing and endplate spur formation throughout mid to inferior cervical spine.  Multilevel facet degenerative changes. Minimal anterolisthesis at C3-C4 and minimal retrolisthesis at C5- C6, likely degenerative. Prevertebral soft tissues normal thickness. Slight rotary subluxation at C1-C2 question related to head rotation. No definite acute fracture, dislocation, or additional subluxation. Scattered atherosclerotic calcifications.  IMPRESSION:  Significant multilevel degenerative disc and facet disease changes of the cervical spine. No definite acute cervical spine abnormalities identified on exam mildly limited by patient motion.   Original Report Authenticated By: Lollie Marrow, M.D.    Ct Cervical Spine Wo Contrast  05/26/2012  *RADIOLOGY REPORT*  Clinical Data:  Fall from standing position  CT HEAD WITHOUT CONTRAST CT CERVICAL SPINE WITHOUT CONTRAST  Technique:  Multidetector CT imaging of the head and cervical spine was performed following the standard protocol without intravenous contrast.  Multiplanar CT image reconstructions of the cervical spine were also generated.  Comparison:  CT head 09/28/2010  CT HEAD  Findings: Generalized atrophy. Normal ventricular morphology. No midline shift or mass effect. Small vessel chronic ischemic changes of deep cerebral white matter. No intracranial hemorrhage, mass lesion, or acute infarction. Visualized  paranasal sinuses and mastoid air cells clear. Bones unremarkable. Asymmetric positioning in gantry. Head rotation to the right. Atherosclerotic calcification of internal carotid arteries at skull base.  IMPRESSION: Atrophy with small vessel chronic ischemic changes of deep cerebral white matter. No acute intracranial abnormalities.  CT CERVICAL SPINE  Findings: Head rotation to the right. Visualized skull base intact. Minimal motion. Artifacts from jewelry. Lung apices clear. Diffuse osseous demineralization. Disc space narrowing and endplate spur formation throughout mid to inferior cervical spine. Multilevel facet degenerative changes. Minimal anterolisthesis at C3-C4 and minimal retrolisthesis at C5- C6, likely degenerative. Prevertebral soft tissues normal thickness. Slight rotary subluxation at C1-C2 question related to head rotation. No definite acute fracture, dislocation, or additional subluxation. Scattered atherosclerotic calcifications.  IMPRESSION:  Significant multilevel degenerative disc and facet disease changes of the cervical spine. No definite acute cervical spine abnormalities identified on exam mildly limited by patient motion.   Original Report Authenticated By: Lollie Marrow, M.D.      1. Fall due to stumbling   2. Contusion of face   3. Skin tear of upper arm without complication       MDM  78 year old female with a mechanical fall, negative CT scan this. Patient with large skin tear. Initially attempted to repair skin tear with Steri-Strips, however patient did not tolerate any manipulation of her arm and refused any further intervention. I have placed a Vaseline gauze and wrapped the area with Curlex. I have given instructions to the nursing home to change dressing once a day with nonadherent dressing.      Tetanus updated one year ago per chart.  Olivia Mackie, MD 05/26/12 [phone]

## 2012-05-26 NOTE — ED Notes (Signed)
Pt removed from LSB and head blocks utilizing log roll technique with 3 staff members, pt remains supine with ccollar in place. Pt placed on bed pain, pt repeatedly requesting pain medication.

## 2012-05-26 NOTE — ED Notes (Signed)
[facility] notified of need for transport back to [facility]

## 2012-05-26 NOTE — ED Notes (Signed)
[facility] called for transport.  

## 2012-05-26 NOTE — ED Notes (Signed)
Pt repeatedly calling staff to room for on/off bedpan, refuses to put arm down, demanding pain medication and attempting to remove c collar, pt educated on importance of maintaining spinal stability in case she has incurred injury.

## 2012-05-26 NOTE — ED Notes (Signed)
Pt transported to radiology via stretcher pt refuses to be taken off bedpan. Skin tear to L arm redressed per Dr. Norlene Campbell with wet dressing, wrapped in [place]. Pt medicated for pain per MD order.

## 2012-05-26 NOTE — ED Notes (Signed)
Pt transported from [facility] by EMS after reporting fall from standing position, skin tear to L elbow area, abrasion to L eye. Pt fully immobilized. A & O.

## 2012-05-31 ENCOUNTER — Encounter (HOSPITAL_COMMUNITY): Payer: Self-pay

## 2012-05-31 ENCOUNTER — Emergency Department (HOSPITAL_COMMUNITY)
Admission: EM | Admit: 2012-05-31 | Discharge: 2012-05-31 | Disposition: A | Discharge status: Home / Self Care | Payer: Medicare Other | Attending: Emergency Medicine | Admitting: Emergency Medicine

## 2012-05-31 DIAGNOSIS — Y9210 Unspecified residential institution as the place of occurrence of the external cause: Secondary | ICD-10-CM | POA: Insufficient documentation

## 2012-05-31 DIAGNOSIS — G8929 Other chronic pain: Secondary | ICD-10-CM | POA: Insufficient documentation

## 2012-05-31 DIAGNOSIS — W19XXXA Unspecified fall, initial encounter: Secondary | ICD-10-CM | POA: Insufficient documentation

## 2012-05-31 DIAGNOSIS — M545 Low back pain, unspecified: Secondary | ICD-10-CM | POA: Insufficient documentation

## 2012-05-31 DIAGNOSIS — N183 Chronic kidney disease, stage 3 unspecified: Secondary | ICD-10-CM | POA: Insufficient documentation

## 2012-05-31 DIAGNOSIS — Z87891 Personal history of nicotine dependence: Secondary | ICD-10-CM | POA: Insufficient documentation

## 2012-05-31 DIAGNOSIS — Z888 Allergy status to other drugs, medicaments and biological substances status: Secondary | ICD-10-CM | POA: Insufficient documentation

## 2012-05-31 DIAGNOSIS — Z049 Encounter for examination and observation for unspecified reason: Secondary | ICD-10-CM | POA: Insufficient documentation

## 2012-05-31 DIAGNOSIS — I4891 Unspecified atrial fibrillation: Secondary | ICD-10-CM | POA: Insufficient documentation

## 2012-05-31 DIAGNOSIS — I129 Hypertensive chronic kidney disease with stage 1 through stage 4 chronic kidney disease, or unspecified chronic kidney disease: Secondary | ICD-10-CM | POA: Insufficient documentation

## 2012-05-31 NOTE — ED Notes (Signed)
Bed:WA09<BR> Expected date:<BR> Expected time:<BR> Means of arrival:<BR> Comments:<BR> fall

## 2012-05-31 NOTE — Discharge Instructions (Signed)
Home Safety and Preventing Falls Falls are a leading cause of injury and while they affect all age groups, falls have greater short-term and long-term impact on older age groups. However, falls should not be a part of life or aging. It is possible for individuals and their families to use preventive measures to significantly decrease the likelihood that anyone, especially an older adult, will fall. There are many simple measures which can make your home safer with respect to preventing falls. The following actions can help reduce falls among all members of your family and are especially important as you age, when your balance, lower limb strength, coordination, and eyesight may be declining. The use of preventive measures will help to reduce you and your family's risk of falls and serious medical consequences. OUTDOORS  Repair cracks and edges of walkways and driveways.   Remove high doorway thresholds and trim shrubbery on the main path into your home.   Ensure there is good outside lighting at main entrances and along main walkways.   Clear walkways of tools, rocks, debris, and clutter.   Check that handrails are not broken and are securely fastened. Both sides of steps should have handrails.   In the garage, be attentive to and clean up grease or oil spills on the cement. This can make the surface extremely slippery.   In winter, have leaves, snow, and ice cleared regularly.   Use sand or salt on walkways during winter months.  BATHROOM  Install grab bars by the toilet and in the tub and shower.   Use non-skid mats or decals in the tub or shower.   If unable to easily stand unsupported while showering, place a plastic non slip stool in the shower to sit on when needed.   Install night lights.   Keep floors dry and clean up all water on the floor immediately.   Remove soap buildup in tub or shower on a regular basis.   Secure bath mats with non-slip, double-sided rug tape.    Remove tripping hazards from the floors.  BEDROOMS  Install night lights.   Do not use oversized bedding.   Make sure a bedside light is easy to reach.   Keep a telephone by your bedside.   Make sure that you can get in and out of your bed easily.   Have a firm chair, with side arms, to use for getting dressed.   Remove clutter from around closets.   Store clothing, bed coverings, and other household items where you can reach them comfortably.   Remove tripping hazards from the floor.  LIVING AREAS AND STAIRWAYS  Turn on lights to avoid having to walk through dark areas.   Keep lighting uniform in each room. Place brighter lightbulbs in darker areas, including stairways.   Replace lightbulbs that burn out in stairways immediately.   Arrange furniture to provide for clear pathways.   Keep furniture in the same place.   Eliminate or tape down electrical cables in high traffic areas.   Place handrails on both sides of stairways. Use handrails when going up or down stairs.   Most falls occur on the top or bottom 3 steps.   Fix any loose handrails. Make sure handrails on both sides of the stairways are as long as the stairs.   Remove all walkway obstacles.   Coil or tape electrical cords off to the side of walking areas and out of the way. If using many extension cords, have an electrician   put in a new wall outlet to reduce or eliminate them.   Make sure spills are cleaned up quickly and allow time for drying before walking on freshly cleaned floors.   Firmly attach carpet with non-skid or two-sided tape.   Keep frequently used items within easy reach.   Remove tripping hazards such as throw rugs and clutter in walkways. Never leave objects on stairs.   Get rid of throw rugs elsewhere if possible.   Eliminate uneven floor surfaces.   Make sure couches and chairs are easy to get into and out of.   Check carpeting to make sure it is firmly attached along stairs.    Make repairs to worn or loose carpet promptly.   Select a carpet pattern that does not visually hide the edge of steps.   Avoid placing throw rugs or scatter rugs at the top or bottom of stairways, or properly secure with carpet tape to prevent slippage.   Have an electrician put in a light switch at the top and bottom of the stairs.   Get light switches that glow.   Avoid the following practices: hurrying, inattention, obscured vision, carrying large loads, and wearing slip-on shoes.   Be aware of all pets.  KITCHEN  Place items that are used frequently, such as dishes and food, within easy reach.   Keep handles on pots and pans toward the center of the stove. Use back burners when possible.   Make sure spills are cleaned up quickly and allow time for drying.   Avoid walking on wet floors.   Avoid hot utensils and knives.   Position shelves so they are not too high or low.   Place commonly used objects within easy reach.   If necessary, use a sturdy step stool with a grab bar when reaching.   Make sure electrical cables are out of the way.   Do not use floor polish or wax that makes floors slippery.  OTHER HOME FALL PREVENTION STRATEGIES  Wear low heel or rubber sole shoes that are supportive and fit well.   Wear closed toe shoes.   Know and watch for side effects of medications. Have your caregiver or pharmacist look at all your medicines, even over-the-counter medicines. Some medicines can make you sleepy or dizzy.   Exercise regularly. Exercise makes you stronger and improves your balance and coordination.   Limit use of alcohol.   Use eyeglasses if necessary and keep them clean. Have your vision checked every year.   Organize your household in a manner that minimizes the need to walk distances when hurried, or go up and down stairs unnecessarily. For example, have a phone placed on at least each floor of your home. If possible, have a phone beside each sitting  or lying area where you spend the most time at home. Keep emergency numbers posted at all phones.   Use non-skid floor wax.   When using a ladder, make sure:   The base is firm.   All ladder feet are on level ground.   The ladder is angled against the wall properly.   When climbing a ladder, face the ladder and hold the ladder rungs firmly.   If reaching, always keep your hips and body weight centered between the rails.   When using a stepladder, make sure it is fully opened and both spreaders are firmly locked.   Do not climb a closed stepladder.   Avoid climbing beyond the second step from the top   of a stepladder and the 4th rung from the top of an extension ladder.   Learn and use mobility aids as needed.   Change positions slowly. Arise slowly from sitting and lying positions. Sit on the edge of your bed before getting to your feet.   If you have a history of falls, ask someone to add color or contrast paint or tape to grab bars and handrails in your home.   If you have a history of falls, ask someone to place contrasting color strips on first and last steps.   Install an electrical emergency response system if you need one, and know how to use it.   If you have a medical or other condition that causes you to have limited physical strength, it is important that you reach out to family and friends for occasional help.  FOR CHILDREN:  If young children are in the home, use safety gates. At the top of stairs use screw-mounted gates; use pressure-mounted gates for the bottom of the stairs and doorways between rooms.   Young children should be taught to descend stairs on their stomachs, feet first, and later using the handrail.   Keep drawers fully closed to prevent them from being climbed on or pulled out entirely.   Move chairs, cribs, beds and other furniture away from windows.   Consider installing window guards on windows ground floor and up, unless they are emergency  fire exits. Make sure they have easy release mechanisms.   Consider installing special locks that only allow the window to be opened to a certain height.   Never rely on window screens to prevent falls.   Never leave babies alone on changing tables, beds or sofas. Use a changing table that has a restraining strap.   When a child can pull to a standing position, the crib mattress should be adjusted to its lowest position. There should be at least 26 inches between the top rails of the crib drop side and the mattress. Toys, bumper pads, and other objects that can be used as steps to climb out should be removed from the crib.   On bunk beds never allow a child under age 6 to sleep on the top bunk. For older children, if the upper bunk is not against a wall, use guard rails on both sides. No matter how old a child is, keep the guard rails in place on the top bunk since children roll during sleep. Do not permit horseplay on bunks.   Grass and soil surfaces beneath backyard playground equipment should be replaced with hardwood chips, shredded wood mulch, sand, pea gravel, rubber, crushed stone, or another safer material at depths of at least 9 to 12 inches.   When riding bikes or using skates, skateboards, skis, or snowboards, require children to wear helmets. Look for those that have stickers stating that they meet or exceed safety standards.   Vertical posts or pickets in deck, balcony, and stairway railings should be no more than 3 1/2 inches apart if a young baby will have access to the area. The space between horizontal rails or bars, and between the floor and the first horizontal rail or bar, should be no more than 3 1/2 inches.  Document Released: 08/28/2002 Document Revised: 08/27/2011 Document Reviewed: 06/27/2009 [employer] Patient Information 2012 [employer]. 

## 2012-05-31 NOTE — ED Notes (Signed)
Per EMS: Pt had an unwitnessed fall where pt fell backwards while using walker. Denies any LOC. Previous fall 1 week ago with bruising to face and skin tear to left upper arm.

## 2012-05-31 NOTE — ED Provider Notes (Signed)
History     CSN: [id number]  Arrival date & time 05/31/12  Christina [name]   First MD Initiated Contact with Patient 05/31/12 0533      Chief Complaint  Patient presents with  . Fall     HPI Per EMS: Pt had an unwitnessed fall where pt fell backwards while using walker. Denies any LOC. Previous fall 1 week ago with bruising to face and skin tear to left upper arm.  Patient denies any headache, neck pain, dizziness.  Patient states she wants go back.  Past Medical History  Diagnosis Date  . Thyroid disease   . Atrial fibrillation   . Hypertension   . SBO (small bowel obstruction)   . Hypothyroidism   . CHF (congestive heart failure)   . Atrial fibrillation   . CKD (chronic kidney disease) stage 3, GFR 30-59 ml/min   . Chronic low back pain   . History of narcotic addiction   . History of alcohol abuse     Past Surgical History  Procedure Date  . Back surgery   . Abdominal hysterectomy   . Rotator cuff repair     History reviewed. No pertinent family history.  History  Substance Use Topics  . Smoking status: Former Games developer  . Smokeless tobacco: Not on file  . Alcohol Use: No     former etoh abuse    OB History    Grav Para Term Preterm Abortions TAB SAB Ect Mult Living                  Review of Systems  All other systems reviewed and are negative.    Allergies  Lisinopril; Lyrica; Macrodantin; and Sudafed  Home Medications   Current Outpatient Rx  Name Route Sig Dispense Refill  . ACETAMINOPHEN 500 MG PO TABS Oral Take 500 mg by mouth 3 (three) times daily.    Marland Kitchen CRANBERRY 250 MG PO CAPS Oral Take 1 capsule by mouth 2 (two) times daily.    . FUROSEMIDE 20 MG PO TABS Oral Take 20 mg by mouth daily. In the am as needed for edema.    Marland Kitchen LEVOTHYROXINE SODIUM 50 MCG PO TABS Oral Take 50 mcg by mouth daily.     Marland Kitchen LIDOCAINE 5 % EX PTCH Transdermal Place 2 patches onto the skin daily. Remove & Discard patch within 12 hours or as directed by MD    . MIRABEGRON ER 50 MG  PO TB24 Oral Take 50 mg by mouth daily as needed. For bladder spasms    . SACCHAROMYCES BOULARDII 250 MG PO CAPS Oral Take 250 mg by mouth daily.    . SERTRALINE HCL 25 MG PO TABS Oral Take 25 mg by mouth daily.    . EUCERIN EX CREA Topical Apply 1 application topically 2 (two) times daily. Applies to arms and legs daily    . TRAMADOL HCL 50 MG PO TABS Oral Take 50 mg by mouth 2 (two) times daily. pain    . TRAMADOL HCL 50 MG PO TABS Oral Take 50 mg by mouth every 8 (eight) hours as needed. Pain    . TRAZODONE HCL 50 MG PO TABS Oral Take 75 mg by mouth at bedtime.       BP 115/64  Pulse 79  Temp 98.5 F (36.9 C) (Oral)  Resp 20  SpO2 93%  Physical Exam  Nursing note and vitals reviewed. Constitutional: She is oriented to person, place, and time. She appears well-developed. No distress.  HENT:  Head: Normocephalic and atraumatic.  Eyes: Pupils are equal, round, and reactive to light.  Neck: Normal range of motion. No spinous process tenderness and no muscular tenderness present. Normal range of motion present.    Cardiovascular: Normal rate and intact distal pulses.   Pulmonary/Chest: No respiratory distress.  Abdominal: Normal appearance. She exhibits no distension.  Musculoskeletal: Normal range of motion.       Arms: Neurological: She is alert and oriented to person, place, and time. No cranial nerve deficit.  Skin: Skin is warm and dry. No rash noted.  Psychiatric: She has a normal mood and affect. Her behavior is normal.    ED Course  Procedures (including critical care time)  Labs Reviewed - No data to display No results found.   1. Fall       MDM          Nelia Shi, MD 05/31/12 [phone]

## 2012-09-07 ENCOUNTER — Emergency Department (HOSPITAL_COMMUNITY): Payer: Medicare Other

## 2012-09-07 ENCOUNTER — Encounter (HOSPITAL_COMMUNITY): Payer: Self-pay | Admitting: *Deleted

## 2012-09-07 ENCOUNTER — Emergency Department (HOSPITAL_COMMUNITY)
Admission: EM | Admit: 2012-09-07 | Discharge: 2012-09-07 | Disposition: A | Discharge status: Home / Self Care | Payer: Medicare Other | Attending: Emergency Medicine | Admitting: Emergency Medicine

## 2012-09-07 DIAGNOSIS — I509 Heart failure, unspecified: Secondary | ICD-10-CM | POA: Insufficient documentation

## 2012-09-07 DIAGNOSIS — Z8719 Personal history of other diseases of the digestive system: Secondary | ICD-10-CM | POA: Insufficient documentation

## 2012-09-07 DIAGNOSIS — F1010 Alcohol abuse, uncomplicated: Secondary | ICD-10-CM | POA: Insufficient documentation

## 2012-09-07 DIAGNOSIS — W010XXA Fall on same level from slipping, tripping and stumbling without subsequent striking against object, initial encounter: Secondary | ICD-10-CM | POA: Insufficient documentation

## 2012-09-07 DIAGNOSIS — IMO0002 Reserved for concepts with insufficient information to code with codable children: Secondary | ICD-10-CM | POA: Insufficient documentation

## 2012-09-07 DIAGNOSIS — W19XXXA Unspecified fall, initial encounter: Secondary | ICD-10-CM

## 2012-09-07 DIAGNOSIS — N183 Chronic kidney disease, stage 3 unspecified: Secondary | ICD-10-CM | POA: Insufficient documentation

## 2012-09-07 DIAGNOSIS — M549 Dorsalgia, unspecified: Secondary | ICD-10-CM

## 2012-09-07 DIAGNOSIS — I129 Hypertensive chronic kidney disease with stage 1 through stage 4 chronic kidney disease, or unspecified chronic kidney disease: Secondary | ICD-10-CM | POA: Insufficient documentation

## 2012-09-07 DIAGNOSIS — E039 Hypothyroidism, unspecified: Secondary | ICD-10-CM | POA: Insufficient documentation

## 2012-09-07 DIAGNOSIS — Y9210 Unspecified residential institution as the place of occurrence of the external cause: Secondary | ICD-10-CM | POA: Insufficient documentation

## 2012-09-07 DIAGNOSIS — Z87891 Personal history of nicotine dependence: Secondary | ICD-10-CM | POA: Insufficient documentation

## 2012-09-07 DIAGNOSIS — Z79899 Other long term (current) drug therapy: Secondary | ICD-10-CM | POA: Insufficient documentation

## 2012-09-07 DIAGNOSIS — I10 Essential (primary) hypertension: Secondary | ICD-10-CM | POA: Insufficient documentation

## 2012-09-07 DIAGNOSIS — Z8679 Personal history of other diseases of the circulatory system: Secondary | ICD-10-CM | POA: Insufficient documentation

## 2012-09-07 DIAGNOSIS — Y939 Activity, unspecified: Secondary | ICD-10-CM | POA: Insufficient documentation

## 2012-09-07 MED ORDER — HYDROCODONE-ACETAMINOPHEN 5-325 MG PO TABS
1.0000 | ORAL_TABLET | Freq: Once | ORAL | Status: AC
  Administered 2012-09-07: 1 via ORAL
  Filled 2012-09-07: qty 1

## 2012-09-07 NOTE — ED Notes (Signed)
[id number]<AV> Expected date:<BR> Expected time:<BR> Means of arrival:Ambulance<BR> Comments:<BR> 87yof-fall/neck/back/head pain

## 2012-09-07 NOTE — ED Notes (Signed)
Pt unable to walk due to back pain. Pt placed on bedpan and left with call bell per pts request

## 2012-09-07 NOTE — ED Provider Notes (Signed)
History     CSN: [id number]  Arrival date & time 09/07/12  1910   First MD Initiated Contact with Patient 09/07/12 2010      Chief Complaint  Patient presents with  . Fall   HPI  History provided by the patient. Patient is a 78 year old female with history of hypertension, CHF, A. fib and chronic low back and mid back pain presents from a nursing home after a fall. Patient was getting up to go to the bathroom and reports tripping and falling. She denies hitting her head or having LOC. She complains of increased low mid back pain greater than her usual chronic pains. Patient has not tried to stand or walk since her fall and was transported by EMS. She denies any head or neck pains. She denies any pain to her upper extremities. She reports some pain to her bilateral hips. She currently denies headache to me. Denies any weakness or numbness in her extremities. She denies having any chest pain, shortness of breath or heart palpitations.    Past Medical History  Diagnosis Date  . Thyroid disease   . Atrial fibrillation   . Hypertension   . SBO (small bowel obstruction)   . Hypothyroidism   . CHF (congestive heart failure)   . Atrial fibrillation   . CKD (chronic kidney disease) stage 3, GFR 30-59 ml/min   . Chronic low back pain   . History of narcotic addiction   . History of alcohol abuse     Past Surgical History  Procedure Date  . Back surgery   . Abdominal hysterectomy   . Rotator cuff repair     History reviewed. No pertinent family history.  History  Substance Use Topics  . Smoking status: Former Games developer  . Smokeless tobacco: Not on file  . Alcohol Use: No     Comment: former etoh abuse    OB History    Grav Para Term Preterm Abortions TAB SAB Ect Mult Living                  Review of Systems  HENT: Negative for neck pain.   Musculoskeletal: Positive for back pain.  Neurological: Negative for dizziness, weakness, light-headedness, numbness and headaches.    All other systems reviewed and are negative.    Allergies  Lisinopril; Lyrica; Macrodantin; and Sudafed  Home Medications   Current Outpatient Rx  Name  Route  Sig  Dispense  Refill  . ACETAMINOPHEN 500 MG PO TABS   Oral   Take 500 mg by mouth 3 (three) times daily.         Marland Kitchen CRANBERRY 250 MG PO CAPS   Oral   Take 1 capsule by mouth 2 (two) times daily.         . FUROSEMIDE 20 MG PO TABS   Oral   Take 20 mg by mouth daily. In the am as needed for edema.         Marland Kitchen LEVOTHYROXINE SODIUM 50 MCG PO TABS   Oral   Take 50 mcg by mouth daily.          Marland Kitchen LIDOCAINE 5 % EX PTCH   Transdermal   Place 2 patches onto the skin daily. Remove & Discard patch within 12 hours or as directed by MD         . MIRABEGRON ER 50 MG PO TB24   Oral   Take 50 mg by mouth daily as needed. For bladder spasms         .  PROBIOTIC PO CAPS   Oral   Take 1 capsule by mouth every morning.         Marland Kitchen SACCHAROMYCES BOULARDII 250 MG PO CAPS   Oral   Take 250 mg by mouth daily.         . SERTRALINE HCL 25 MG PO TABS   Oral   Take 25 mg by mouth daily.         . EUCERIN EX CREA   Topical   Apply 1 application topically 2 (two) times daily. Applies to arms and legs daily         . TRAMADOL HCL 50 MG PO TABS   Oral   Take 50 mg by mouth 2 (two) times daily. pain         . TRAMADOL HCL 50 MG PO TABS   Oral   Take 50 mg by mouth every 8 (eight) hours as needed. AS NEEDED Pain.         . TRAZODONE HCL 50 MG PO TABS   Oral   Take 75 mg by mouth at bedtime.            BP 161/90  Pulse 90  Temp 97.7 F (36.5 C) (Oral)  Resp 18  SpO2 98%  Physical Exam  Nursing note and vitals reviewed. Constitutional: She is oriented to person, place, and time. She appears well-developed and well-nourished. No distress.  HENT:  Head: Normocephalic and atraumatic.       No signs of head trauma. No battle sign or raccoon eyes.  Eyes: Conjunctivae normal and EOM are normal. Pupils  are equal, round, and reactive to light.  Neck: Normal range of motion. Neck supple.       No cervical midline tenderness. Full range of motion.  Cardiovascular: Normal rate.   No murmur heard. Pulmonary/Chest: Effort normal and breath sounds normal. No respiratory distress. She has no wheezes. She has no rales.  Abdominal: Soft. There is no tenderness. There is no rigidity, no rebound, no guarding, no CVA tenderness and no tenderness at McBurney's point.  Musculoskeletal:       Thoracic back: She exhibits tenderness and bony tenderness.       Back:  Neurological: She is alert and oriented to person, place, and time. She has normal strength. No cranial nerve deficit or sensory deficit.       Strength equal bilaterally non-extremities and appears at baseline  Skin: Skin is warm and dry.  Psychiatric: She has a normal mood and affect. Her behavior is normal.    ED Course  Procedures   Dg Thoracic Spine 2 View  09/07/2012  *RADIOLOGY REPORT*  Clinical Data: Fall.  Back pain  THORACIC SPINE - 2 VIEW  Comparison: 10/18/2011  Findings: Dextroscoliosis in the lumbar spine.  Negative for fracture in the thoracic spine.  Mild disc degeneration.  No focal bony lesion.  IMPRESSION: Negative for fracture.   Original Report Authenticated By: Janeece Riggers, M.D.    Ct Head Wo Contrast  09/07/2012  *RADIOLOGY REPORT*  Clinical Data:  Fall  CT HEAD WITHOUT CONTRAST CT CERVICAL SPINE WITHOUT CONTRAST  Technique:  Multidetector CT imaging of the head and cervical spine was performed following the standard protocol without intravenous contrast.  Multiplanar CT image reconstructions of the cervical spine were also generated.  Comparison:  05/26/2012  CT HEAD  Findings: Generalized atrophy of a moderate to advanced degree. Chronic microvascular ischemia in the white matter.  Negative for acute infarct.  Negative for hemorrhage or mass or skull fracture.  IMPRESSION: Atrophy and chronic microvascular ischemia.  No  acute abnormality.  CT CERVICAL SPINE  Findings: Negative for fracture.  Anterior slip C3-4 and C4-5 is unchanged.  There is cervical disc degeneration and spondylosis throughout the cervical spine.  Facet degeneration at C3-4 and C4-5  IMPRESSION: Negative for fracture.   Original Report Authenticated By: Janeece Riggers, M.D.    Ct Cervical Spine Wo Contrast  09/07/2012  *RADIOLOGY REPORT*  Clinical Data:  Fall  CT HEAD WITHOUT CONTRAST CT CERVICAL SPINE WITHOUT CONTRAST  Technique:  Multidetector CT imaging of the head and cervical spine was performed following the standard protocol without intravenous contrast.  Multiplanar CT image reconstructions of the cervical spine were also generated.  Comparison:  05/26/2012  CT HEAD  Findings: Generalized atrophy of a moderate to advanced degree. Chronic microvascular ischemia in the white matter.  Negative for acute infarct.  Negative for hemorrhage or mass or skull fracture.  IMPRESSION: Atrophy and chronic microvascular ischemia.  No acute abnormality.  CT CERVICAL SPINE  Findings: Negative for fracture.  Anterior slip C3-4 and C4-5 is unchanged.  There is cervical disc degeneration and spondylosis throughout the cervical spine.  Facet degeneration at C3-4 and C4-5  IMPRESSION: Negative for fracture.   Original Report Authenticated By: Janeece Riggers, M.D.      1. Fall   2. Chronic back pain       MDM  8:45 PM patient seen and evaluated. Patient resting comfortably in bed without any complaints at this time.   Patient discussed with attending physician. X-rays unremarkable. Patient is stable for discharge back to nursing home.    Angus Seller, [place] 09/08/12 [phone]

## 2012-09-07 NOTE — ED Notes (Signed)
Pt currently expressing desire to void but unable to void on bedpan.

## 2012-09-07 NOTE — ED Notes (Signed)
Per EMS report; Pt from [facility]: Pt got up to go to the bathroom and tripped and fell.  Pt does not remember how she fell (i.e. Not sure if she tripped) but denies loss of consciousness. C/o back pain, neck pain, hip pain, head pain.  EMS did not note any deformities or crepitus.  Pt is not on blood thinners.

## 2012-09-07 NOTE — ED Notes (Signed)
PA at bedside.

## 2012-09-07 NOTE — ED Notes (Signed)
[facility] called for transport.  

## 2012-09-08 NOTE — ED Provider Notes (Signed)
Medical screening examination/treatment/procedure(s) were performed by non-physician practitioner and as supervising physician I was immediately available for consultation/collaboration.  Zaida Reiland R. Lucy Woolever, MD 09/08/12 2356 

## 2012-12-12 ENCOUNTER — Encounter (HOSPITAL_COMMUNITY): Payer: Self-pay | Admitting: *Deleted

## 2012-12-12 ENCOUNTER — Emergency Department (HOSPITAL_COMMUNITY): Payer: Medicare Other

## 2012-12-12 ENCOUNTER — Emergency Department (HOSPITAL_COMMUNITY)
Admission: EM | Admit: 2012-12-12 | Discharge: 2012-12-12 | Disposition: A | Discharge status: Skilled Nursing Facility | Payer: Medicare Other | Attending: Emergency Medicine | Admitting: Emergency Medicine

## 2012-12-12 DIAGNOSIS — F1921 Other psychoactive substance dependence, in remission: Secondary | ICD-10-CM | POA: Insufficient documentation

## 2012-12-12 DIAGNOSIS — N183 Chronic kidney disease, stage 3 unspecified: Secondary | ICD-10-CM | POA: Insufficient documentation

## 2012-12-12 DIAGNOSIS — G8929 Other chronic pain: Secondary | ICD-10-CM | POA: Insufficient documentation

## 2012-12-12 DIAGNOSIS — M545 Low back pain, unspecified: Secondary | ICD-10-CM | POA: Insufficient documentation

## 2012-12-12 DIAGNOSIS — F1011 Alcohol abuse, in remission: Secondary | ICD-10-CM | POA: Insufficient documentation

## 2012-12-12 DIAGNOSIS — Z8719 Personal history of other diseases of the digestive system: Secondary | ICD-10-CM | POA: Insufficient documentation

## 2012-12-12 DIAGNOSIS — I509 Heart failure, unspecified: Secondary | ICD-10-CM | POA: Insufficient documentation

## 2012-12-12 DIAGNOSIS — Z87891 Personal history of nicotine dependence: Secondary | ICD-10-CM | POA: Insufficient documentation

## 2012-12-12 DIAGNOSIS — E039 Hypothyroidism, unspecified: Secondary | ICD-10-CM | POA: Insufficient documentation

## 2012-12-12 DIAGNOSIS — R531 Weakness: Secondary | ICD-10-CM

## 2012-12-12 DIAGNOSIS — I129 Hypertensive chronic kidney disease with stage 1 through stage 4 chronic kidney disease, or unspecified chronic kidney disease: Secondary | ICD-10-CM | POA: Insufficient documentation

## 2012-12-12 DIAGNOSIS — Z79899 Other long term (current) drug therapy: Secondary | ICD-10-CM | POA: Insufficient documentation

## 2012-12-12 DIAGNOSIS — I10 Essential (primary) hypertension: Secondary | ICD-10-CM

## 2012-12-12 DIAGNOSIS — I4891 Unspecified atrial fibrillation: Secondary | ICD-10-CM | POA: Insufficient documentation

## 2012-12-12 DIAGNOSIS — R5381 Other malaise: Secondary | ICD-10-CM | POA: Insufficient documentation

## 2012-12-12 DIAGNOSIS — E079 Disorder of thyroid, unspecified: Secondary | ICD-10-CM | POA: Insufficient documentation

## 2012-12-12 LAB — CBC
Hemoglobin: 11.1 g/dL — ABNORMAL LOW (ref 12.0–15.0)
MCH: 31 pg (ref 26.0–34.0)
MCV: 90.8 fL (ref 78.0–100.0)
RBC: 3.58 MIL/uL — ABNORMAL LOW (ref 3.87–5.11)

## 2012-12-12 LAB — BASIC METABOLIC PANEL
CO2: 29 mEq/L (ref 19–32)
Calcium: 9.5 mg/dL (ref 8.4–10.5)
Chloride: 99 mEq/L (ref 96–112)
Glucose, Bld: 89 mg/dL (ref 70–99)
Sodium: 139 mEq/L (ref 135–145)

## 2012-12-12 LAB — URINALYSIS, ROUTINE W REFLEX MICROSCOPIC
Glucose, UA: NEGATIVE mg/dL
Ketones, ur: NEGATIVE mg/dL
Specific Gravity, Urine: 1.012 (ref 1.005–1.030)
pH: 7 (ref 5.0–8.0)

## 2012-12-12 LAB — URINE MICROSCOPIC-ADD ON

## 2012-12-12 MED ORDER — SODIUM CHLORIDE 0.9 % IV SOLN: 1000.0000 mL | INTRAVENOUS | Status: DC

## 2012-12-12 MED ORDER — HYDROCHLOROTHIAZIDE 12.5 MG PO CAPS
25.0000 mg | ORAL_CAPSULE | Freq: Once | ORAL | Status: AC
  Administered 2012-12-12: 25 mg via ORAL
  Filled 2012-12-12: qty 2

## 2012-12-12 MED ORDER — SODIUM CHLORIDE 0.9 % IV SOLN
1000.0000 mL | Freq: Once | INTRAVENOUS | Status: AC
  Administered 2012-12-12: 1000 mL via INTRAVENOUS

## 2012-12-12 MED ORDER — HYDROCHLOROTHIAZIDE 25 MG PO TABS: 25.0000 mg | ORAL_TABLET | Freq: Every day | ORAL | Status: DC

## 2012-12-12 NOTE — ED Notes (Signed)
Pt sts nursing home called an ems due to high blood pressure, pt denies diarrhea or any other GI symptoms. Pt denies pain.

## 2012-12-12 NOTE — ED Provider Notes (Signed)
History     CSN: [id number]  Arrival date & time 12/12/12  1201   First MD Initiated Contact with Patient 12/12/12 1225      Chief Complaint  Patient presents with  . Hypertension  . Diarrhea     The history is provided by the patient.   the patient was sent to the emergency department from her assisted living Center with complaints of hypertension and diarrhea.  It sounds as though the patient said several days of diarrhea.  She denies nausea and vomiting.  No chest pain shortness of breath.  No abdominal pain.  She reports this morning she fell just generally weak and felt too weak to get up.  Family reports she has a long-standing history of easy dehydration states she usually responds well to IV fluids.  Family reports no confusion the bedside now.  The patient is then to be hypertensive in route with blood pressure 220/110.  The patient has been taken off her hydrochlorothiazide and this is never been restarted.  Patient has a history of renal insufficiency.  She continues to urinate without significant difficulty.  No recent sick contacts.  She's otherwise compliant with her medications.  She denies unilateral leg or arm weakness.  No reported slurred speech or difficulty with speech.  She denies headache at this time.  No change in vision.  Past Medical History  Diagnosis Date  . Thyroid disease   . Atrial fibrillation   . Hypertension   . SBO (small bowel obstruction)   . Hypothyroidism   . CHF (congestive heart failure)   . Atrial fibrillation   . CKD (chronic kidney disease) stage 3, GFR 30-59 ml/min   . Chronic low back pain   . History of narcotic addiction   . History of alcohol abuse     Past Surgical History  Procedure Laterality Date  . Back surgery    . Abdominal hysterectomy    . Rotator cuff repair      History reviewed. No pertinent family history.  History  Substance Use Topics  . Smoking status: Former Games developer  . Smokeless tobacco: Not on file  .  Alcohol Use: No     Comment: former etoh abuse    OB History   Grav Para Term Preterm Abortions TAB SAB Ect Mult Living                  Review of Systems  All other systems reviewed and are negative.    Allergies  Lisinopril; Lyrica; Macrodantin; and Sudafed  Home Medications   Current Outpatient Rx  Name  Route  Sig  Dispense  Refill  . acetaminophen (TYLENOL) 500 MG tablet   Oral   Take 500 mg by mouth 3 (three) times daily.         . Cranberry 250 MG CAPS   Oral   Take 1 capsule by mouth 2 (two) times daily.         . furosemide (LASIX) 20 MG tablet   Oral   Take 20 mg by mouth daily. In the am as needed for edema.         Marland Kitchen levothyroxine (SYNTHROID, LEVOTHROID) 50 MCG tablet   Oral   Take 50 mcg by mouth daily.          Marland Kitchen lidocaine (LIDODERM) 5 %   Transdermal   Place 2 patches onto the skin daily. Remove & Discard patch within 12 hours or as directed by MD         .  mirabegron ER (MYRBETRIQ) 50 MG TB24   Oral   Take 50 mg by mouth daily as needed. For bladder spasms         . omeprazole (PRILOSEC) 20 MG capsule   Oral   Take 20 mg by mouth every morning.         Marland Kitchen PROBIOTIC CAPS   Oral   Take 1 capsule by mouth every morning.         . saccharomyces boulardii (FLORASTOR) 250 MG capsule   Oral   Take 250 mg by mouth daily.         . sertraline (ZOLOFT) 25 MG tablet   Oral   Take 25 mg by mouth daily.         . Skin Protectants, Misc. (EUCERIN) cream   Topical   Apply 1 application topically 2 (two) times daily. Applies to arms and legs daily         . traMADol (ULTRAM) 50 MG tablet   Oral   Take 50 mg by mouth 2 (two) times daily. pain         . traMADol (ULTRAM) 50 MG tablet   Oral   Take 50 mg by mouth 3 (three) times daily as needed. Also 3 times daily as needed         . traZODone (DESYREL) 50 MG tablet   Oral   Take 75 mg by mouth at bedtime.            BP 173/68  Pulse 69  Temp(Src) 98.1 F (36.7  C) (Oral)  Resp 16  SpO2 99%  Physical Exam  Nursing note and vitals reviewed. Constitutional: She is oriented to person, place, and time. She appears well-developed and well-nourished. No distress.  Hypertensive  HENT:  Head: Normocephalic and atraumatic.  Eyes: EOM are normal. Pupils are equal, round, and reactive to light.  Neck: Normal range of motion.  Cardiovascular: Normal rate, regular rhythm and normal heart sounds.   Pulmonary/Chest: Effort normal and breath sounds normal.  Abdominal: Soft. She exhibits no distension. There is no tenderness.  Musculoskeletal: Normal range of motion.  Neurological: She is alert and oriented to person, place, and time.  5/5 strength in major muscle groups of  bilateral upper and lower extremities. Speech normal. No facial asymetry.   Skin: Skin is warm and dry.  Psychiatric: She has a normal mood and affect. Judgment normal.    ED Course  Procedures (including critical care time)   Date: 12/12/2012  Rate: 78  Rhythm: Atrial fibrillation  QRS Axis: normal  Intervals: normal  ST/T Wave abnormalities: normal  Conduction Disutrbances: none  Narrative Interpretation:   Old EKG Reviewed: No significant changes noted     Labs Reviewed  CBC - Abnormal; Notable for the following:    RBC 3.58 (*)    Hemoglobin 11.1 (*)    HCT 32.5 (*)    All other components within normal limits  BASIC METABOLIC PANEL - Abnormal; Notable for the following:    BUN 26 (*)    GFR calc non Af Amer 50 (*)    GFR calc Af Amer 58 (*)    All other components within normal limits  TROPONIN I   Ct Head Wo Contrast  12/12/2012  *RADIOLOGY REPORT*  Clinical Data: Hypertension, weakness  CT HEAD WITHOUT CONTRAST  Technique:  Contiguous axial images were obtained from the base of the skull through the vertex without contrast.  Comparison: 09/07/2012  Findings: Generalized atrophy.  Normal ventricular morphology. No midline shift or mass effect. Small vessel  chronic ischemic changes of deep cerebral white matter. No intracranial hemorrhage, mass lesion, or acute infarction. Visualized paranasal sinuses and mastoid air cells clear. Bones unremarkable. Atherosclerotic calcifications within internal carotid arteries at skull base.  IMPRESSION: Atrophy with small vessel chronic ischemic changes of deep cerebral white matter. No acute intracranial abnormalities.   Original Report Authenticated By: Ulyses Southward, M.D.    I personally reviewed the imaging tests through PACS system I reviewed available ER/hospitalization records through the EMR   1. Weakness   2. Hypertension       MDM  4:52 PM The patient feels much better at this time after IV fluids.  Labs without significant abnormality.  Blood pressure improved with restarting her home hydrochlorothiazide.  It sounds like this was stopped for renal insufficiency at some point in never started back.  Head CT is normal.  Discharge home with PCP followup.  Normal neurologic exam.       Lyanne Co, MD 12/12/12 [phone]

## 2012-12-12 NOTE — ED Notes (Signed)
Patient transported to CT 

## 2012-12-12 NOTE — ED Notes (Signed)
Pt ambulated in room with 1 person assistance.

## 2012-12-12 NOTE — ED Notes (Signed)
Bed:WHALA<BR> Expected date:12/12/12<BR> Expected time:11:56 AM<BR> Means of arrival:Ambulance<BR> Comments:<BR> Htn/ diarrhea

## 2012-12-12 NOTE — ED Notes (Signed)
Per EMS: pt comeing from [facility] with c/o hypertension and diarrhea. Per EMS b/p en route 220/110, hr 170 palpated. Pt has hx of afib.

## 2012-12-15 LAB — URINE CULTURE: Colony Count: 10000

## 2012-12-16 ENCOUNTER — Telehealth (HOSPITAL_COMMUNITY): Payer: Self-pay | Admitting: Emergency Medicine

## 2012-12-16 NOTE — ED Notes (Signed)
Patient has +Urine culture. Checking to see if treated appropriately. °

## 2012-12-16 NOTE — ED Notes (Addendum)
+  URINE. Only 10,000 colonies. No further treatment needed.

## 2013-10-11 ENCOUNTER — Emergency Department (HOSPITAL_COMMUNITY): Payer: Medicare Other

## 2013-10-11 ENCOUNTER — Emergency Department (HOSPITAL_COMMUNITY)
Admission: EM | Admit: 2013-10-11 | Discharge: 2013-10-11 | Disposition: A | Discharge status: Home / Self Care | Payer: Medicare Other | Attending: Emergency Medicine | Admitting: Emergency Medicine

## 2013-10-11 ENCOUNTER — Encounter (HOSPITAL_COMMUNITY): Payer: Self-pay | Admitting: Emergency Medicine

## 2013-10-11 DIAGNOSIS — I129 Hypertensive chronic kidney disease with stage 1 through stage 4 chronic kidney disease, or unspecified chronic kidney disease: Secondary | ICD-10-CM | POA: Insufficient documentation

## 2013-10-11 DIAGNOSIS — R410 Disorientation, unspecified: Secondary | ICD-10-CM

## 2013-10-11 DIAGNOSIS — G8929 Other chronic pain: Secondary | ICD-10-CM | POA: Insufficient documentation

## 2013-10-11 DIAGNOSIS — N183 Chronic kidney disease, stage 3 unspecified: Secondary | ICD-10-CM | POA: Insufficient documentation

## 2013-10-11 DIAGNOSIS — I509 Heart failure, unspecified: Secondary | ICD-10-CM | POA: Insufficient documentation

## 2013-10-11 DIAGNOSIS — Z87891 Personal history of nicotine dependence: Secondary | ICD-10-CM | POA: Insufficient documentation

## 2013-10-11 DIAGNOSIS — Z87440 Personal history of urinary (tract) infections: Secondary | ICD-10-CM | POA: Insufficient documentation

## 2013-10-11 DIAGNOSIS — M545 Low back pain, unspecified: Secondary | ICD-10-CM | POA: Insufficient documentation

## 2013-10-11 DIAGNOSIS — E039 Hypothyroidism, unspecified: Secondary | ICD-10-CM | POA: Insufficient documentation

## 2013-10-11 DIAGNOSIS — F29 Unspecified psychosis not due to a substance or known physiological condition: Secondary | ICD-10-CM | POA: Insufficient documentation

## 2013-10-11 DIAGNOSIS — I4891 Unspecified atrial fibrillation: Secondary | ICD-10-CM | POA: Insufficient documentation

## 2013-10-11 DIAGNOSIS — Z79899 Other long term (current) drug therapy: Secondary | ICD-10-CM | POA: Insufficient documentation

## 2013-10-11 DIAGNOSIS — Z8719 Personal history of other diseases of the digestive system: Secondary | ICD-10-CM | POA: Insufficient documentation

## 2013-10-11 LAB — CBC WITH DIFFERENTIAL/PLATELET
Basophils Absolute: 0 10*3/uL (ref 0.0–0.1)
Basophils Relative: 0 % (ref 0–1)
EOS ABS: 0 10*3/uL (ref 0.0–0.7)
Eosinophils Relative: 0 % (ref 0–5)
HCT: 37 % (ref 36.0–46.0)
Hemoglobin: 12.6 g/dL (ref 12.0–15.0)
LYMPHS ABS: 1.4 10*3/uL (ref 0.7–4.0)
LYMPHS PCT: 12 % (ref 12–46)
MCH: 31.3 pg (ref 26.0–34.0)
MCHC: 34.1 g/dL (ref 30.0–36.0)
MCV: 92 fL (ref 78.0–100.0)
Monocytes Absolute: 1.8 10*3/uL — ABNORMAL HIGH (ref 0.1–1.0)
Monocytes Relative: 16 % — ABNORMAL HIGH (ref 3–12)
NEUTROS ABS: 8.4 10*3/uL — AB (ref 1.7–7.7)
NEUTROS PCT: 72 % (ref 43–77)
PLATELETS: 155 10*3/uL (ref 150–400)
RBC: 4.02 MIL/uL (ref 3.87–5.11)
RDW: 13.5 % (ref 11.5–15.5)
WBC: 11.7 10*3/uL — AB (ref 4.0–10.5)

## 2013-10-11 LAB — URINALYSIS, ROUTINE W REFLEX MICROSCOPIC
Bilirubin Urine: NEGATIVE
Glucose, UA: NEGATIVE mg/dL
Ketones, ur: NEGATIVE mg/dL
LEUKOCYTES UA: NEGATIVE
NITRITE: NEGATIVE
PH: 6 (ref 5.0–8.0)
Protein, ur: 30 mg/dL — AB
SPECIFIC GRAVITY, URINE: 1.021 (ref 1.005–1.030)
UROBILINOGEN UA: 1 mg/dL (ref 0.0–1.0)

## 2013-10-11 LAB — BASIC METABOLIC PANEL
BUN: 28 mg/dL — ABNORMAL HIGH (ref 6–23)
CHLORIDE: 101 meq/L (ref 96–112)
CO2: 27 mEq/L (ref 19–32)
Calcium: 9.7 mg/dL (ref 8.4–10.5)
Creatinine, Ser: 0.97 mg/dL (ref 0.50–1.10)
GFR calc Af Amer: 60 mL/min — ABNORMAL LOW (ref >90)
GFR, EST NON AFRICAN AMERICAN: 51 mL/min — AB (ref >90)
GLUCOSE: 123 mg/dL — AB (ref 70–99)
POTASSIUM: 4 meq/L (ref 3.7–5.3)
SODIUM: 141 meq/L (ref 137–147)

## 2013-10-11 LAB — URINE MICROSCOPIC-ADD ON

## 2013-10-11 NOTE — ED Provider Notes (Signed)
CSN: [id number]     Arrival date & time 10/11/13  1717 History   First MD Initiated Contact with Patient 10/11/13 1724     Chief Complaint  Patient presents with  . Altered Mental Status   (Consider location/radiation/quality/duration/timing/severity/associated sxs/prior Treatment) HPI  This is an 80 yo female with a history of atrial fibrillation, hypertension and recurrent UTIs who presents with altered mental status. She presents from [facility]. Per EMS report, they were called out for altered mental status and possible UTI. Patient was found to be in atrial fibrillation on the monitor. She has a known history of atrial fibrillation. Patient is awake and oriented x2. Per the patient's daughter, this is at the patient's baseline. She has no physical complaints at this time she does state "I think I have UTI." Per staff at [facility], the patient was reported to be more "lethargic today." She was also reported to have difficulty ambulating to the bathroom by herself and had found smelling urine. No reported fevers. The staff reported that the patient seemed to be very confused and difficulty finding her words when asked specific questions.  Past Medical History  Diagnosis Date  . Thyroid disease   . Atrial fibrillation   . Hypertension   . SBO (small bowel obstruction)   . Hypothyroidism   . CHF (congestive heart failure)   . Atrial fibrillation   . CKD (chronic kidney disease) stage 3, GFR 30-59 ml/min   . Chronic low back pain   . History of narcotic addiction   . History of alcohol abuse    Past Surgical History  Procedure Laterality Date  . Back surgery    . Abdominal hysterectomy    . Rotator cuff repair     No family history on file. History  Substance Use Topics  . Smoking status: Former Games developer  . Smokeless tobacco: Not on file  . Alcohol Use: No     Comment: former etoh abuse   OB History   Grav Para Term Preterm Abortions TAB SAB Ect Mult Living            Review of Systems  Constitutional: Negative for fever.  Respiratory: Negative for cough, chest tightness and shortness of breath.   Cardiovascular: Negative for chest pain.  Gastrointestinal: Negative for nausea, vomiting and abdominal pain.  Genitourinary: Negative for dysuria.  Musculoskeletal: Negative for back pain.  Skin: Negative for wound.  Neurological: Negative for headaches.  Psychiatric/Behavioral: Positive for confusion. Negative for agitation.  All other systems reviewed and are negative.    Allergies  Lisinopril; Lyrica; Macrodantin; and Sudafed  Home Medications   Current Outpatient Rx  Name  Route  Sig  Dispense  Refill  . acetaminophen (TYLENOL) 325 MG tablet   Oral   Take 325 mg by mouth 3 (three) times daily.          . Cranberry 250 MG CAPS   Oral   Take 1 capsule by mouth 2 (two) times daily.         . furosemide (LASIX) 20 MG tablet   Oral   Take 20 mg by mouth daily. In the am as needed for edema.         Marland Kitchen levothyroxine (SYNTHROID, LEVOTHROID) 50 MCG tablet   Oral   Take 50 mcg by mouth daily.          Marland Kitchen lidocaine (LIDODERM) 5 %   Transdermal   Place 2 patches onto the skin daily. Remove & Discard  patch within 12 hours or as directed by MD         . lisinopril (PRINIVIL,ZESTRIL) 5 MG tablet   Oral   Take 5 mg by mouth daily.         . mirabegron ER (MYRBETRIQ) 50 MG TB24   Oral   Take 50 mg by mouth daily as needed. For bladder spasms         . omeprazole (PRILOSEC) 20 MG capsule   Oral   Take 20 mg by mouth every morning.         Marland Kitchen PROBIOTIC CAPS   Oral   Take 1 capsule by mouth every morning.         . rivastigmine (EXELON) 4.6 mg/24hr   Transdermal   Place 4.6 mg onto the skin daily.         . Skin Protectants, Misc. (EUCERIN) cream   Topical   Apply 1 application topically 2 (two) times daily. Applies to arms and legs daily         . traMADol (ULTRAM) 50 MG tablet   Oral   Take 50 mg by  mouth 2 (two) times daily. pain         . traMADol (ULTRAM) 50 MG tablet   Oral   Take 50 mg by mouth 3 (three) times daily as needed. *prn dose, do not give within 4 hours of scheduled dose*         . traZODone (DESYREL) 50 MG tablet   Oral   Take 50 mg by mouth at bedtime.           BP 144/125  Pulse 83  Temp(Src) 98.6 F (37 C) (Oral)  Resp 16  SpO2 96% Physical Exam  Nursing note and vitals reviewed. Constitutional: No distress.  Elderly  HENT:  Head: Normocephalic and atraumatic.  Eyes: Pupils are equal, round, and reactive to light.  Neck: Neck supple.  Cardiovascular: Normal rate and normal heart sounds.   Irregular rhythm  Pulmonary/Chest: Effort normal and breath sounds normal. No respiratory distress. She has no wheezes.  Abdominal: Soft. Bowel sounds are normal. There is no tenderness. There is no rebound.  Musculoskeletal: She exhibits no edema.  Neurological: She is alert. No cranial nerve deficit. Coordination normal.  Oriented to person and time  Skin: Skin is warm and dry.  Psychiatric: She has a normal mood and affect.    ED Course  Procedures (including critical care time) Labs Review Labs Reviewed  URINALYSIS, ROUTINE W REFLEX MICROSCOPIC - Abnormal; Notable for the following:    Hgb urine dipstick MODERATE (*)    Protein, ur 30 (*)    All other components within normal limits  CBC WITH DIFFERENTIAL - Abnormal; Notable for the following:    WBC 11.7 (*)    Neutro Abs 8.4 (*)    Monocytes Relative 16 (*)    Monocytes Absolute 1.8 (*)    All other components within normal limits  BASIC METABOLIC PANEL - Abnormal; Notable for the following:    Glucose, Bld 123 (*)    BUN 28 (*)    GFR calc non Af Amer 51 (*)    GFR calc Af Amer 60 (*)    All other components within normal limits  URINE CULTURE  URINE MICROSCOPIC-ADD ON   Imaging Review Ct Head Wo Contrast  10/11/2013   CLINICAL DATA:  Altered mental status.  Atrial fibrillation.   EXAM: CT HEAD WITHOUT CONTRAST  TECHNIQUE: Contiguous axial images  were obtained from the base of the skull through the vertex without intravenous contrast.  COMPARISON:  CT HEAD W/O CM dated 12/12/2012  FINDINGS: Sinuses/Soft tissues: Clear paranasal sinuses and mastoid air cells.  Intracranial: Moderate low density in the periventricular white matter likely related to small vessel disease. No mass lesion, hemorrhage, hydrocephalus, acute infarct, intra-axial, or extra-axial fluid collection.  IMPRESSION: 1.  No acute intracranial abnormality. 2. Moderate small vessel ischemic change.   Electronically Signed   By: Jeronimo GreavesKyle  Talbot M.D.   On: 10/11/2013 20:09    EKG Interpretation    Date/Time:  Wednesday October 11 2013 17:38:35 EST Ventricular Rate:  93 PR Interval:    QRS Duration: 97 QT Interval:  371 QTC Calculation: 461 R Axis:   49 Text Interpretation:  Atrial fibrillation Confirmed by HORTON  MD, COURTNEY ([id number]) on 10/11/2013 6:18:43 PM            MDM   1. Confusion   2. Atrial fibrillation    Patient presents with confusion and possible UTI.  I spoke on the phone with the technician who cares for the patient. They were concerned that the patient was less ambulatory and more lethargic this morning. They reported foul-smelling urine and patient has a history of urinary tract infections. She was also noted to have "difficulty finding words." No weakness or other focal deficit noted.  During my evaluation, patient is oriented x2.  Per the patient's daughter, she is at her baseline. Workup initiated.  Workup is largely unremarkable. Patient has a known history of atrial fibrillation but his rate control. Urine is without evidence of infection. Will send culture. Discussed workup with the daughter.  Given the patient is at baseline, will be discharged back to her living facility. She was given strict return precautions.  After history, exam, and medical workup I feel the patient has been  appropriately medically screened and is safe for discharge home. Pertinent diagnoses were discussed with the patient. Patient was given return precautions.     Shon Batonourtney F Horton, MD 10/13/13 0730

## 2013-10-11 NOTE — ED Notes (Signed)
Per ems [facility] for [facility] status and possible UTI. Pt put on monitor found to be in a. Fib pt has no known PMH of atrial fibrilation. Pt answers orientation questions appropriately. Staff sts pt appears more lethargic than baseline

## 2013-10-11 NOTE — ED Notes (Signed)
[facility] to [facility] left VM to call back this RN for report.

## 2013-10-11 NOTE — ED Notes (Signed)
Report given to [employer]. [facility]tar arrived to transfer patient

## 2013-10-11 NOTE — Discharge Instructions (Signed)
Altered Mental Status °Altered mental status most often refers to an abnormal change in your responsiveness and awareness. It can affect your speech, thought, mobility, memory, attention span, or alertness. It can range from slight confusion to complete unresponsiveness (coma). Altered mental status can be a sign of a serious underlying medical condition. Rapid evaluation and medical treatment is necessary for patients having an altered mental status. °CAUSES  °· Low blood sugar (hypoglycemia) or diabetes. °· Severe loss of body fluids (dehydration) or a body salt (electrolyte) imbalance. °· A stroke or other neurologic problem, such as dementia or delirium. °· A head injury or tumor. °· A drug or alcohol overdose. °· Exposure to toxins or poisons. °· Depression, anxiety, and stress. °· A low oxygen level (hypoxia). °· An infection. °· Blood loss. °· Twitching or shaking (seizure). °· Heart problems, such as heart attack or heart rhythm problems (arrhythmias). °· A body temperature that is too low or too high (hypothermia or hyperthermia). °DIAGNOSIS  °A diagnosis is based on your history, symptoms, physical and neurologic examinations, and diagnostic tests. Diagnostic tests may include: °· Measurement of your blood pressure, pulse, breathing, and oxygen levels (vital signs). °· Blood tests. °· Urine tests. °· X-ray exams. °· A computerized magnetic scan (magnetic resonance imaging, MRI). °· A computerized X-ray scan (computed tomography, CT scan). °TREATMENT  °Treatment will depend on the cause. Treatment may include: °· Management of an underlying medical or mental health condition. °· Critical care or support in the hospital. °HOME CARE INSTRUCTIONS  °· Only take over-the-counter or prescription medicines for pain, discomfort, or fever as directed by your caregiver. °· Manage underlying conditions as directed by your caregiver. °· Eat a healthy, well-balanced diet to maintain strength. °· Join a support group or  prevention program to cope with the condition or trauma that caused the altered mental status. Ask your caregiver to help choose a program that works for you. °· Follow up with your caregiver for further examination, therapy, or testing as directed. °SEEK MEDICAL CARE IF:  °· You feel unwell or have chills. °· You or your family notice a change in your behavior or your alertness. °· You have trouble following your caregiver's treatment plan. °· You have questions or concerns. °SEEK IMMEDIATE MEDICAL CARE IF:  °· You have a rapid heartbeat or have chest pain. °· You have difficulty breathing. °· You have a fever. °· You have a headache with a stiff neck. °· You cough up blood. °· You have blood in your urine or stool. °· You have severe agitation or confusion. °MAKE SURE YOU:  °· Understand these instructions. °· Will watch your condition. °· Will get help right away if you are not doing well or get worse. °Document Released: 02/25/2010 Document Revised: 11/30/2011 Document Reviewed: 02/25/2010 °ExitCare® Patient Information ©2014 [employer]. ° °

## 2013-10-11 NOTE — ED Notes (Signed)
Dee- [employer] operational officersecretary notified to call [facility].

## 2013-10-11 NOTE — ED Notes (Signed)
Lauren med tech-   sts pt has had foul smelling urine, pt was lethargic, did not eat breakfast or lunch and unable to walk. sts pt had slurred speech and difficulty forming sentences approximately 4pm.  Pt uses walker and has some trouble normally, today unable to walk.  Dr. Wilkie AyeHorton updated and given phone to speak to med tech.

## 2013-10-13 LAB — URINE CULTURE
Colony Count: NO GROWTH
Culture: NO GROWTH

## 2013-10-18 ENCOUNTER — Emergency Department (HOSPITAL_COMMUNITY)
Admission: EM | Admit: 2013-10-18 | Discharge: 2013-10-18 | Disposition: A | Discharge status: Home / Self Care | Payer: Medicare Other | Attending: Emergency Medicine | Admitting: Emergency Medicine

## 2013-10-18 ENCOUNTER — Encounter (HOSPITAL_COMMUNITY): Payer: Self-pay | Admitting: Emergency Medicine

## 2013-10-18 ENCOUNTER — Emergency Department (HOSPITAL_COMMUNITY): Payer: Medicare Other

## 2013-10-18 DIAGNOSIS — F0390 Unspecified dementia without behavioral disturbance: Secondary | ICD-10-CM | POA: Insufficient documentation

## 2013-10-18 DIAGNOSIS — Z87891 Personal history of nicotine dependence: Secondary | ICD-10-CM | POA: Insufficient documentation

## 2013-10-18 DIAGNOSIS — I129 Hypertensive chronic kidney disease with stage 1 through stage 4 chronic kidney disease, or unspecified chronic kidney disease: Secondary | ICD-10-CM | POA: Insufficient documentation

## 2013-10-18 DIAGNOSIS — S0003XA Contusion of scalp, initial encounter: Secondary | ICD-10-CM | POA: Insufficient documentation

## 2013-10-18 DIAGNOSIS — G8929 Other chronic pain: Secondary | ICD-10-CM | POA: Insufficient documentation

## 2013-10-18 DIAGNOSIS — S0083XA Contusion of other part of head, initial encounter: Principal | ICD-10-CM

## 2013-10-18 DIAGNOSIS — S1093XA Contusion of unspecified part of neck, initial encounter: Principal | ICD-10-CM

## 2013-10-18 DIAGNOSIS — Y9210 Unspecified residential institution as the place of occurrence of the external cause: Secondary | ICD-10-CM | POA: Insufficient documentation

## 2013-10-18 DIAGNOSIS — I509 Heart failure, unspecified: Secondary | ICD-10-CM | POA: Insufficient documentation

## 2013-10-18 DIAGNOSIS — Z8719 Personal history of other diseases of the digestive system: Secondary | ICD-10-CM | POA: Insufficient documentation

## 2013-10-18 DIAGNOSIS — M545 Low back pain, unspecified: Secondary | ICD-10-CM | POA: Insufficient documentation

## 2013-10-18 DIAGNOSIS — Y939 Activity, unspecified: Secondary | ICD-10-CM | POA: Insufficient documentation

## 2013-10-18 DIAGNOSIS — Z79899 Other long term (current) drug therapy: Secondary | ICD-10-CM | POA: Insufficient documentation

## 2013-10-18 DIAGNOSIS — E039 Hypothyroidism, unspecified: Secondary | ICD-10-CM | POA: Insufficient documentation

## 2013-10-18 DIAGNOSIS — S0990XA Unspecified injury of head, initial encounter: Secondary | ICD-10-CM

## 2013-10-18 DIAGNOSIS — N183 Chronic kidney disease, stage 3 unspecified: Secondary | ICD-10-CM | POA: Insufficient documentation

## 2013-10-18 DIAGNOSIS — W19XXXA Unspecified fall, initial encounter: Secondary | ICD-10-CM | POA: Insufficient documentation

## 2013-10-18 MED ORDER — HYDROCODONE-ACETAMINOPHEN 5-325 MG PO TABS
1.0000 | ORAL_TABLET | Freq: Once | ORAL | Status: AC
  Administered 2013-10-18: 1 via ORAL
  Filled 2013-10-18: qty 1

## 2013-10-18 MED ORDER — ONDANSETRON 4 MG PO TBDP
4.0000 mg | ORAL_TABLET | Freq: Once | ORAL | Status: AC
  Administered 2013-10-18: 4 mg via ORAL
  Filled 2013-10-18: qty 1

## 2013-10-18 NOTE — ED Notes (Addendum)
[facility]- pt had an unwitnessed fall this morning about 45 minutes ago. Pt has a hematoma to the back of her head. Pt reports increased dizziness and staff reports increased confusion. Bp- 186/84 Hr-108 20g lac. [place]

## 2013-10-18 NOTE — ED Notes (Signed)
C-collar removed by charge nurse.

## 2013-10-18 NOTE — Discharge Instructions (Signed)
Head Injury, Adult  You have received a head injury. It does not appear serious at this time. Headaches and vomiting are common following head injury. It should be easy to awaken from sleeping. Sometimes it is necessary for you to stay in the emergency department for a while for observation. Sometimes admission to the hospital may be needed. After injuries such as yours, most problems occur within the first 24 hours, but side effects may occur up to 7 10 days after the injury. It is important for you to carefully monitor your condition and contact your health care provider or seek immediate medical care if there is a change in your condition.  WHAT ARE THE TYPES OF HEAD INJURIES?  Head injuries can be as minor as a bump. Some head injuries can be more severe. More severe head injuries include:  · A jarring injury to the brain (concussion).  · A bruise of the brain (contusion). This mean there is bleeding in the brain that can cause swelling.  · A cracked skull (skull fracture).  · Bleeding in the brain that collects, clots, and forms a bump (hematoma).  WHAT CAUSES A HEAD INJURY?  A serious head injury is most likely to happen to someone who is in a car wreck and is not wearing a seat belt. Other causes of major head injuries include bicycle or motorcycle accidents, sports injuries, and falls.  HOW ARE HEAD INJURIES DIAGNOSED?  A complete history of the event leading to the injury and your current symptoms will be helpful in diagnosing head injuries. Many times, pictures of the brain, such as CT or MRI are needed to see the extent of the injury. Often, an overnight hospital stay is necessary for observation.   WHEN SHOULD I SEEK IMMEDIATE MEDICAL CARE?   You should get help right away if:  · You have confusion or drowsiness.  · You feel sick to your stomach (nauseous) or have continued, forceful vomiting.  · You have dizziness or unsteadiness that is getting worse.  · You have severe, continued headaches not  relieved by medicine. Only take over-the-counter or prescription medicines for pain, fever, or discomfort as directed by your health care provider.  · You do not have normal function of the arms or legs or are unable to walk.  · You notice changes in the black spots in the center of the colored part of your eye (pupil).  · You have a clear or bloody fluid coming from your nose or ears.  · You have a loss of vision.  During the next 24 hours after the injury, you must stay with someone who can watch you for the warning signs. This person should contact local emergency services (911 in the [place] if you have seizures, you become unconscious, or you are unable to wake up.  HOW CAN I PREVENT A HEAD INJURY IN THE FUTURE?  The most important factor for preventing major head injuries is avoiding motor vehicle accidents.  To minimize the potential for damage to your head, it is crucial to wear seat belts while riding in motor vehicles. Wearing helmets while bike riding and playing collision sports (like football) is also helpful. Also, avoiding dangerous activities around the house will further help reduce your risk of head injury.   WHEN CAN I RETURN TO NORMAL ACTIVITIES AND ATHLETICS?  You should be reevaluated by your health care provider before returning to these activities. If you have any of the following symptoms, you should not return   to activities or contact sports until 1 week after the symptoms have stopped:  · Persistent headache.  · Dizziness or vertigo.  · Poor attention and concentration.  · Confusion.  · Memory problems.  · Nausea or vomiting.  · Fatigue or tire easily.  · Irritability.  · Intolerant of bright lights or loud noises.  · Anxiety or depression.  · Disturbed sleep.  MAKE SURE YOU:   · Understand these instructions.  · Will watch your condition.  · Will get help right away if you are not doing well or get worse.  Document Released: 09/07/2005 Document Revised: 06/28/2013 Document Reviewed:  05/15/2013  ExitCare® Patient Information ©2014 [employer].

## 2013-10-18 NOTE — ED Notes (Signed)
Notified [facility] for transportation back home 

## 2013-10-20 NOTE — ED Provider Notes (Signed)
CSN: [id number]     Arrival date & time 10/18/13  1318 History   First MD Initiated Contact with Patient 10/18/13 1324     Chief Complaint  Patient presents with  . Fall    HPI  Patient presents after a fall at her nursing facility. Apparently this was from a sitting position. His hematoma to the back of her head. She is immobilized in a TED, a blanket around her neck functioning as a cervical collar, and transported via paramedics. She would not allow placement of the rigid cervical collar. The blanket was the only way she would allow paramedics to immobilize her neck. This is functioning well. She denies any loss of consciousness. Paramedics state that their description of the incident from the facility stated there was no loss of consciousness. She has had no weakness or complaint of weakness or numbness of her extremities. This was a witnessed fall, not a syncopal episode.  Level V caveat for dementia  Past Medical History  Diagnosis Date  . Thyroid disease   . Atrial fibrillation   . Hypertension   . SBO (small bowel obstruction)   . Hypothyroidism   . CHF (congestive heart failure)   . Atrial fibrillation   . CKD (chronic kidney disease) stage 3, GFR 30-59 ml/min   . Chronic low back pain   . History of narcotic addiction   . History of alcohol abuse    Past Surgical History  Procedure Laterality Date  . Back surgery    . Abdominal hysterectomy    . Rotator cuff repair     History reviewed. No pertinent family history. History  Substance Use Topics  . Smoking status: Former Games developer  . Smokeless tobacco: Not on file  . Alcohol Use: No     Comment: former etoh abuse   OB History   Grav Para Term Preterm Abortions TAB SAB Ect Mult Living                 Review of Systems  Unable to perform ROS: Dementia    Allergies  Lisinopril; Lyrica; Sudafed; and Macrodantin  Home Medications   Current Outpatient Rx  Name  Route  Sig  Dispense  Refill  . acetaminophen  (TYLENOL) 325 MG tablet   Oral   Take 325 mg by mouth 3 (three) times daily.          . Cranberry 250 MG CAPS   Oral   Take 250 mg by mouth 2 (two) times daily.          Marland Kitchen levothyroxine (SYNTHROID, LEVOTHROID) 50 MCG tablet   Oral   Take 50 mcg by mouth daily.          Marland Kitchen lidocaine (LIDODERM) 5 %   Transdermal   Place 2 patches onto the skin daily. Remove & Discard patch within 12 hours or as directed by MD         . lisinopril (PRINIVIL,ZESTRIL) 5 MG tablet   Oral   Take 5 mg by mouth daily.         Marland Kitchen omeprazole (PRILOSEC) 20 MG capsule   Oral   Take 20 mg by mouth every morning.         Marland Kitchen PROBIOTIC CAPS   Oral   Take 1 capsule by mouth every morning.         . rivastigmine (EXELON) 4.6 mg/24hr   Transdermal   Place 4.6 mg onto the skin daily.         Marland Kitchen  traMADol (ULTRAM) 50 MG tablet   Oral   Take 50 mg by mouth 2 (two) times daily. pain         . traMADol (ULTRAM) 50 MG tablet   Oral   Take 50 mg by mouth 3 (three) times daily as needed. *prn dose, do not give within 4 hours of scheduled dose*         . traZODone (DESYREL) 50 MG tablet   Oral   Take 50 mg by mouth at bedtime.           BP 165/50  Pulse 80  Temp(Src) 99.7 F (37.6 C) (Oral)  Resp 18  SpO2 99% Physical Exam  Constitutional:  Awake and alert. Follows our conversation well  HENT:  Head:    Eyes:  Pupils are equal round and reactive. Extraocular movements are intact.  Neck:  She has no complaint of tenderness down the midline of the spine from the sacrum to the occiput  Cardiovascular:  Irregular. Atrial  Fibrillation. Rate control.  Pulmonary/Chest:  Lungs are clear and equal  Abdominal:  Soft benign abdomen  Neurological:  Normal symmetric Strength to shoulder shrug, triceps, biceps, grip,wrist flex/extend,and intrinsics  Norma lsymmetric sensation above and below clavicles, and to all distributions to UEs. Norma symmetric strength to flex/.extend hip and  knees, dorsi/plantar flex ankles. Normal symmetric sensation to all distributions to LEs Patellar and achilles reflexes 1-2+. Downgoing Babinski   Psychiatric:  Calm and appropriate    ED Course  Procedures (including critical care time) Labs Review Labs Reviewed - No data to display Imaging Review Ct Head Wo Contrast  10/18/2013   CLINICAL DATA:  Fall.  Head injury.  EXAM: CT HEAD WITHOUT CONTRAST  CT CERVICAL SPINE WITHOUT CONTRAST  TECHNIQUE: Multidetector CT imaging of the head and cervical spine was performed following the standard protocol without intravenous contrast. Multiplanar CT image reconstructions of the cervical spine were also generated.  COMPARISON:  CT 10/11/2013  FINDINGS: CT HEAD FINDINGS  Moderate to large right parietal scalp hematoma. Negative for skull fracture.  Moderate to advanced atrophy. Chronic microvascular ischemic changes in the white matter.  Negative for intracranial hemorrhage.  No acute infarct or mass.  CT CERVICAL SPINE FINDINGS  2 mm anterior slip C3-4 appears degenerative. 3.5 mm anterior slip C4-5 appears degenerative. Mild posterior slip T1-T2 appears degenerative. There is diffuse cervical disc degeneration and facet degeneration.  Negative for fracture.  Carotid artery calcification  IMPRESSION: Moderate to large right parietal scalp hematoma. No fracture or intracranial hemorrhage  Advanced cervical spondylosis. Negative for cervical spine fracture.   Electronically Signed   By: Marlan Palau M.D.   On: 10/18/2013 14:18   Ct Cervical Spine Wo Contrast  10/18/2013   CLINICAL DATA:  Fall.  Head injury.  EXAM: CT HEAD WITHOUT CONTRAST  CT CERVICAL SPINE WITHOUT CONTRAST  TECHNIQUE: Multidetector CT imaging of the head and cervical spine was performed following the standard protocol without intravenous contrast. Multiplanar CT image reconstructions of the cervical spine were also generated.  COMPARISON:  CT 10/11/2013  FINDINGS: CT HEAD FINDINGS  Moderate  to large right parietal scalp hematoma. Negative for skull fracture.  Moderate to advanced atrophy. Chronic microvascular ischemic changes in the white matter.  Negative for intracranial hemorrhage.  No acute infarct or mass.  CT CERVICAL SPINE FINDINGS  2 mm anterior slip C3-4 appears degenerative. 3.5 mm anterior slip C4-5 appears degenerative. Mild posterior slip T1-T2 appears degenerative. There is diffuse cervical disc degeneration  and facet degeneration.  Negative for fracture.  Carotid artery calcification  IMPRESSION: Moderate to large right parietal scalp hematoma. No fracture or intracranial hemorrhage  Advanced cervical spondylosis. Negative for cervical spine fracture.   Electronically Signed   By: Marlan Palauharles  Clark M.D.   On: 10/18/2013 14:18    EKG Interpretation   None       MDM   1. Scalp contusion   2. Head injury    CT scan of the head and neck are normal. No acute abnormality is of the soft tissue swelling noted. She remains awake alert. Feel she is appropriate for discharge with no differential additional diagnostics.    Rolland PorterMark Setareh Rom, MD 10/20/13 1039

## 2015-10-18 DIAGNOSIS — R2689 Other abnormalities of gait and mobility: Secondary | ICD-10-CM | POA: Diagnosis not present

## 2015-10-18 DIAGNOSIS — D509 Iron deficiency anemia, unspecified: Secondary | ICD-10-CM | POA: Diagnosis not present

## 2015-10-18 DIAGNOSIS — I4891 Unspecified atrial fibrillation: Secondary | ICD-10-CM | POA: Diagnosis not present

## 2015-10-18 DIAGNOSIS — I5032 Chronic diastolic (congestive) heart failure: Secondary | ICD-10-CM | POA: Diagnosis not present

## 2015-10-18 DIAGNOSIS — I10 Essential (primary) hypertension: Secondary | ICD-10-CM | POA: Diagnosis not present

## 2015-10-18 DIAGNOSIS — E039 Hypothyroidism, unspecified: Secondary | ICD-10-CM | POA: Diagnosis not present

## 2015-10-18 DIAGNOSIS — Z9181 History of falling: Secondary | ICD-10-CM | POA: Diagnosis not present

## 2015-10-18 DIAGNOSIS — N183 Chronic kidney disease, stage 3 (moderate): Secondary | ICD-10-CM | POA: Diagnosis not present

## 2015-10-18 DIAGNOSIS — F0390 Unspecified dementia without behavioral disturbance: Secondary | ICD-10-CM | POA: Diagnosis not present

## 2015-10-19 DIAGNOSIS — E119 Type 2 diabetes mellitus without complications: Secondary | ICD-10-CM | POA: Diagnosis not present

## 2015-11-18 DIAGNOSIS — N183 Chronic kidney disease, stage 3 (moderate): Secondary | ICD-10-CM | POA: Diagnosis not present

## 2015-11-18 DIAGNOSIS — M6281 Muscle weakness (generalized): Secondary | ICD-10-CM | POA: Diagnosis not present

## 2015-11-18 DIAGNOSIS — J449 Chronic obstructive pulmonary disease, unspecified: Secondary | ICD-10-CM | POA: Diagnosis not present

## 2015-11-19 DIAGNOSIS — J449 Chronic obstructive pulmonary disease, unspecified: Secondary | ICD-10-CM | POA: Diagnosis not present

## 2015-11-19 DIAGNOSIS — N183 Chronic kidney disease, stage 3 (moderate): Secondary | ICD-10-CM | POA: Diagnosis not present

## 2015-11-19 DIAGNOSIS — M6281 Muscle weakness (generalized): Secondary | ICD-10-CM | POA: Diagnosis not present

## 2015-11-20 DIAGNOSIS — J449 Chronic obstructive pulmonary disease, unspecified: Secondary | ICD-10-CM | POA: Diagnosis not present

## 2015-11-20 DIAGNOSIS — M6281 Muscle weakness (generalized): Secondary | ICD-10-CM | POA: Diagnosis not present

## 2015-11-20 DIAGNOSIS — N183 Chronic kidney disease, stage 3 (moderate): Secondary | ICD-10-CM | POA: Diagnosis not present

## 2015-11-21 DIAGNOSIS — J449 Chronic obstructive pulmonary disease, unspecified: Secondary | ICD-10-CM | POA: Diagnosis not present

## 2015-11-21 DIAGNOSIS — N183 Chronic kidney disease, stage 3 (moderate): Secondary | ICD-10-CM | POA: Diagnosis not present

## 2015-11-21 DIAGNOSIS — M6281 Muscle weakness (generalized): Secondary | ICD-10-CM | POA: Diagnosis not present

## 2015-11-22 DIAGNOSIS — J449 Chronic obstructive pulmonary disease, unspecified: Secondary | ICD-10-CM | POA: Diagnosis not present

## 2015-11-22 DIAGNOSIS — N183 Chronic kidney disease, stage 3 (moderate): Secondary | ICD-10-CM | POA: Diagnosis not present

## 2015-11-22 DIAGNOSIS — M6281 Muscle weakness (generalized): Secondary | ICD-10-CM | POA: Diagnosis not present

## 2015-11-25 DIAGNOSIS — D649 Anemia, unspecified: Secondary | ICD-10-CM | POA: Diagnosis not present

## 2015-11-25 DIAGNOSIS — E039 Hypothyroidism, unspecified: Secondary | ICD-10-CM | POA: Diagnosis not present

## 2015-11-25 DIAGNOSIS — M6281 Muscle weakness (generalized): Secondary | ICD-10-CM | POA: Diagnosis not present

## 2015-11-25 DIAGNOSIS — J449 Chronic obstructive pulmonary disease, unspecified: Secondary | ICD-10-CM | POA: Diagnosis not present

## 2015-11-25 DIAGNOSIS — E785 Hyperlipidemia, unspecified: Secondary | ICD-10-CM | POA: Diagnosis not present

## 2015-11-25 DIAGNOSIS — N183 Chronic kidney disease, stage 3 (moderate): Secondary | ICD-10-CM | POA: Diagnosis not present

## 2015-11-25 DIAGNOSIS — E559 Vitamin D deficiency, unspecified: Secondary | ICD-10-CM | POA: Diagnosis not present

## 2015-11-26 DIAGNOSIS — J449 Chronic obstructive pulmonary disease, unspecified: Secondary | ICD-10-CM | POA: Diagnosis not present

## 2015-11-26 DIAGNOSIS — M6281 Muscle weakness (generalized): Secondary | ICD-10-CM | POA: Diagnosis not present

## 2015-11-26 DIAGNOSIS — N183 Chronic kidney disease, stage 3 (moderate): Secondary | ICD-10-CM | POA: Diagnosis not present

## 2015-11-27 DIAGNOSIS — N183 Chronic kidney disease, stage 3 (moderate): Secondary | ICD-10-CM | POA: Diagnosis not present

## 2015-11-27 DIAGNOSIS — M6281 Muscle weakness (generalized): Secondary | ICD-10-CM | POA: Diagnosis not present

## 2015-11-27 DIAGNOSIS — J449 Chronic obstructive pulmonary disease, unspecified: Secondary | ICD-10-CM | POA: Diagnosis not present

## 2015-11-28 DIAGNOSIS — M6281 Muscle weakness (generalized): Secondary | ICD-10-CM | POA: Diagnosis not present

## 2015-11-28 DIAGNOSIS — N183 Chronic kidney disease, stage 3 (moderate): Secondary | ICD-10-CM | POA: Diagnosis not present

## 2015-11-28 DIAGNOSIS — J449 Chronic obstructive pulmonary disease, unspecified: Secondary | ICD-10-CM | POA: Diagnosis not present

## 2015-11-29 DIAGNOSIS — N183 Chronic kidney disease, stage 3 (moderate): Secondary | ICD-10-CM | POA: Diagnosis not present

## 2015-11-29 DIAGNOSIS — M6281 Muscle weakness (generalized): Secondary | ICD-10-CM | POA: Diagnosis not present

## 2015-11-29 DIAGNOSIS — J449 Chronic obstructive pulmonary disease, unspecified: Secondary | ICD-10-CM | POA: Diagnosis not present

## 2015-12-02 DIAGNOSIS — I4891 Unspecified atrial fibrillation: Secondary | ICD-10-CM | POA: Diagnosis not present

## 2015-12-02 DIAGNOSIS — N183 Chronic kidney disease, stage 3 (moderate): Secondary | ICD-10-CM | POA: Diagnosis not present

## 2015-12-02 DIAGNOSIS — D649 Anemia, unspecified: Secondary | ICD-10-CM | POA: Diagnosis not present

## 2015-12-02 DIAGNOSIS — I10 Essential (primary) hypertension: Secondary | ICD-10-CM | POA: Diagnosis not present

## 2015-12-02 DIAGNOSIS — J449 Chronic obstructive pulmonary disease, unspecified: Secondary | ICD-10-CM | POA: Diagnosis not present

## 2015-12-02 DIAGNOSIS — R2681 Unsteadiness on feet: Secondary | ICD-10-CM | POA: Diagnosis not present

## 2015-12-02 DIAGNOSIS — I1310 Hypertensive heart and chronic kidney disease without heart failure, with stage 1 through stage 4 chronic kidney disease, or unspecified chronic kidney disease: Secondary | ICD-10-CM | POA: Diagnosis not present

## 2015-12-02 DIAGNOSIS — F0390 Unspecified dementia without behavioral disturbance: Secondary | ICD-10-CM | POA: Diagnosis not present

## 2015-12-02 DIAGNOSIS — E039 Hypothyroidism, unspecified: Secondary | ICD-10-CM | POA: Diagnosis not present

## 2015-12-02 DIAGNOSIS — M6281 Muscle weakness (generalized): Secondary | ICD-10-CM | POA: Diagnosis not present

## 2015-12-03 DIAGNOSIS — J449 Chronic obstructive pulmonary disease, unspecified: Secondary | ICD-10-CM | POA: Diagnosis not present

## 2015-12-03 DIAGNOSIS — N183 Chronic kidney disease, stage 3 (moderate): Secondary | ICD-10-CM | POA: Diagnosis not present

## 2015-12-03 DIAGNOSIS — M6281 Muscle weakness (generalized): Secondary | ICD-10-CM | POA: Diagnosis not present

## 2015-12-04 DIAGNOSIS — M6281 Muscle weakness (generalized): Secondary | ICD-10-CM | POA: Diagnosis not present

## 2015-12-04 DIAGNOSIS — N183 Chronic kidney disease, stage 3 (moderate): Secondary | ICD-10-CM | POA: Diagnosis not present

## 2015-12-04 DIAGNOSIS — J449 Chronic obstructive pulmonary disease, unspecified: Secondary | ICD-10-CM | POA: Diagnosis not present

## 2015-12-05 DIAGNOSIS — J449 Chronic obstructive pulmonary disease, unspecified: Secondary | ICD-10-CM | POA: Diagnosis not present

## 2015-12-05 DIAGNOSIS — M6281 Muscle weakness (generalized): Secondary | ICD-10-CM | POA: Diagnosis not present

## 2015-12-05 DIAGNOSIS — N183 Chronic kidney disease, stage 3 (moderate): Secondary | ICD-10-CM | POA: Diagnosis not present

## 2015-12-06 DIAGNOSIS — J449 Chronic obstructive pulmonary disease, unspecified: Secondary | ICD-10-CM | POA: Diagnosis not present

## 2015-12-06 DIAGNOSIS — N183 Chronic kidney disease, stage 3 (moderate): Secondary | ICD-10-CM | POA: Diagnosis not present

## 2015-12-06 DIAGNOSIS — M6281 Muscle weakness (generalized): Secondary | ICD-10-CM | POA: Diagnosis not present

## 2015-12-09 DIAGNOSIS — N183 Chronic kidney disease, stage 3 (moderate): Secondary | ICD-10-CM | POA: Diagnosis not present

## 2015-12-09 DIAGNOSIS — J449 Chronic obstructive pulmonary disease, unspecified: Secondary | ICD-10-CM | POA: Diagnosis not present

## 2015-12-09 DIAGNOSIS — M6281 Muscle weakness (generalized): Secondary | ICD-10-CM | POA: Diagnosis not present

## 2015-12-10 DIAGNOSIS — M6281 Muscle weakness (generalized): Secondary | ICD-10-CM | POA: Diagnosis not present

## 2015-12-10 DIAGNOSIS — N183 Chronic kidney disease, stage 3 (moderate): Secondary | ICD-10-CM | POA: Diagnosis not present

## 2015-12-10 DIAGNOSIS — J449 Chronic obstructive pulmonary disease, unspecified: Secondary | ICD-10-CM | POA: Diagnosis not present

## 2015-12-12 DIAGNOSIS — N183 Chronic kidney disease, stage 3 (moderate): Secondary | ICD-10-CM | POA: Diagnosis not present

## 2015-12-12 DIAGNOSIS — J449 Chronic obstructive pulmonary disease, unspecified: Secondary | ICD-10-CM | POA: Diagnosis not present

## 2015-12-12 DIAGNOSIS — M6281 Muscle weakness (generalized): Secondary | ICD-10-CM | POA: Diagnosis not present

## 2015-12-13 DIAGNOSIS — M6281 Muscle weakness (generalized): Secondary | ICD-10-CM | POA: Diagnosis not present

## 2015-12-13 DIAGNOSIS — N183 Chronic kidney disease, stage 3 (moderate): Secondary | ICD-10-CM | POA: Diagnosis not present

## 2015-12-13 DIAGNOSIS — J449 Chronic obstructive pulmonary disease, unspecified: Secondary | ICD-10-CM | POA: Diagnosis not present

## 2015-12-16 DIAGNOSIS — N183 Chronic kidney disease, stage 3 (moderate): Secondary | ICD-10-CM | POA: Diagnosis not present

## 2015-12-16 DIAGNOSIS — J449 Chronic obstructive pulmonary disease, unspecified: Secondary | ICD-10-CM | POA: Diagnosis not present

## 2015-12-16 DIAGNOSIS — M6281 Muscle weakness (generalized): Secondary | ICD-10-CM | POA: Diagnosis not present

## 2015-12-17 DIAGNOSIS — J449 Chronic obstructive pulmonary disease, unspecified: Secondary | ICD-10-CM | POA: Diagnosis not present

## 2015-12-17 DIAGNOSIS — N183 Chronic kidney disease, stage 3 (moderate): Secondary | ICD-10-CM | POA: Diagnosis not present

## 2015-12-17 DIAGNOSIS — M6281 Muscle weakness (generalized): Secondary | ICD-10-CM | POA: Diagnosis not present

## 2015-12-18 DIAGNOSIS — N183 Chronic kidney disease, stage 3 (moderate): Secondary | ICD-10-CM | POA: Diagnosis not present

## 2015-12-18 DIAGNOSIS — M6281 Muscle weakness (generalized): Secondary | ICD-10-CM | POA: Diagnosis not present

## 2015-12-18 DIAGNOSIS — J449 Chronic obstructive pulmonary disease, unspecified: Secondary | ICD-10-CM | POA: Diagnosis not present

## 2015-12-19 DIAGNOSIS — J449 Chronic obstructive pulmonary disease, unspecified: Secondary | ICD-10-CM | POA: Diagnosis not present

## 2015-12-19 DIAGNOSIS — N183 Chronic kidney disease, stage 3 (moderate): Secondary | ICD-10-CM | POA: Diagnosis not present

## 2015-12-19 DIAGNOSIS — M6281 Muscle weakness (generalized): Secondary | ICD-10-CM | POA: Diagnosis not present

## 2015-12-20 DIAGNOSIS — J449 Chronic obstructive pulmonary disease, unspecified: Secondary | ICD-10-CM | POA: Diagnosis not present

## 2015-12-20 DIAGNOSIS — M6281 Muscle weakness (generalized): Secondary | ICD-10-CM | POA: Diagnosis not present

## 2015-12-20 DIAGNOSIS — N183 Chronic kidney disease, stage 3 (moderate): Secondary | ICD-10-CM | POA: Diagnosis not present

## 2015-12-23 DIAGNOSIS — N183 Chronic kidney disease, stage 3 (moderate): Secondary | ICD-10-CM | POA: Diagnosis not present

## 2015-12-23 DIAGNOSIS — J449 Chronic obstructive pulmonary disease, unspecified: Secondary | ICD-10-CM | POA: Diagnosis not present

## 2015-12-23 DIAGNOSIS — M6281 Muscle weakness (generalized): Secondary | ICD-10-CM | POA: Diagnosis not present

## 2015-12-24 DIAGNOSIS — N183 Chronic kidney disease, stage 3 (moderate): Secondary | ICD-10-CM | POA: Diagnosis not present

## 2015-12-24 DIAGNOSIS — J449 Chronic obstructive pulmonary disease, unspecified: Secondary | ICD-10-CM | POA: Diagnosis not present

## 2015-12-24 DIAGNOSIS — M6281 Muscle weakness (generalized): Secondary | ICD-10-CM | POA: Diagnosis not present

## 2016-01-03 DIAGNOSIS — M79672 Pain in left foot: Secondary | ICD-10-CM | POA: Diagnosis not present

## 2016-01-03 DIAGNOSIS — B351 Tinea unguium: Secondary | ICD-10-CM | POA: Diagnosis not present

## 2016-01-03 DIAGNOSIS — I739 Peripheral vascular disease, unspecified: Secondary | ICD-10-CM | POA: Diagnosis not present

## 2016-01-03 DIAGNOSIS — M79671 Pain in right foot: Secondary | ICD-10-CM | POA: Diagnosis not present

## 2016-01-27 DIAGNOSIS — H353112 Nonexudative age-related macular degeneration, right eye, intermediate dry stage: Secondary | ICD-10-CM | POA: Diagnosis not present

## 2016-01-27 DIAGNOSIS — H353121 Nonexudative age-related macular degeneration, left eye, early dry stage: Secondary | ICD-10-CM | POA: Diagnosis not present

## 2016-01-27 DIAGNOSIS — H04123 Dry eye syndrome of bilateral lacrimal glands: Secondary | ICD-10-CM | POA: Diagnosis not present

## 2016-01-27 DIAGNOSIS — Z961 Presence of intraocular lens: Secondary | ICD-10-CM | POA: Diagnosis not present

## 2016-02-14 DIAGNOSIS — F0390 Unspecified dementia without behavioral disturbance: Secondary | ICD-10-CM | POA: Diagnosis not present

## 2016-02-14 DIAGNOSIS — N183 Chronic kidney disease, stage 3 (moderate): Secondary | ICD-10-CM | POA: Diagnosis not present

## 2016-02-14 DIAGNOSIS — R2681 Unsteadiness on feet: Secondary | ICD-10-CM | POA: Diagnosis not present

## 2016-02-14 DIAGNOSIS — D649 Anemia, unspecified: Secondary | ICD-10-CM | POA: Diagnosis not present

## 2016-02-14 DIAGNOSIS — I10 Essential (primary) hypertension: Secondary | ICD-10-CM | POA: Diagnosis not present

## 2016-02-14 DIAGNOSIS — I4891 Unspecified atrial fibrillation: Secondary | ICD-10-CM | POA: Diagnosis not present

## 2016-02-14 DIAGNOSIS — E039 Hypothyroidism, unspecified: Secondary | ICD-10-CM | POA: Diagnosis not present

## 2016-02-14 DIAGNOSIS — I5033 Acute on chronic diastolic (congestive) heart failure: Secondary | ICD-10-CM | POA: Diagnosis not present

## 2016-04-15 DIAGNOSIS — R278 Other lack of coordination: Secondary | ICD-10-CM | POA: Diagnosis not present

## 2016-04-15 DIAGNOSIS — F0390 Unspecified dementia without behavioral disturbance: Secondary | ICD-10-CM | POA: Diagnosis not present

## 2016-04-16 DIAGNOSIS — R278 Other lack of coordination: Secondary | ICD-10-CM | POA: Diagnosis not present

## 2016-04-16 DIAGNOSIS — F0390 Unspecified dementia without behavioral disturbance: Secondary | ICD-10-CM | POA: Diagnosis not present

## 2016-04-17 DIAGNOSIS — F0390 Unspecified dementia without behavioral disturbance: Secondary | ICD-10-CM | POA: Diagnosis not present

## 2016-04-17 DIAGNOSIS — R278 Other lack of coordination: Secondary | ICD-10-CM | POA: Diagnosis not present

## 2016-04-20 DIAGNOSIS — M79672 Pain in left foot: Secondary | ICD-10-CM | POA: Diagnosis not present

## 2016-04-20 DIAGNOSIS — B351 Tinea unguium: Secondary | ICD-10-CM | POA: Diagnosis not present

## 2016-04-20 DIAGNOSIS — F0390 Unspecified dementia without behavioral disturbance: Secondary | ICD-10-CM | POA: Diagnosis not present

## 2016-04-20 DIAGNOSIS — M79671 Pain in right foot: Secondary | ICD-10-CM | POA: Diagnosis not present

## 2016-04-20 DIAGNOSIS — I739 Peripheral vascular disease, unspecified: Secondary | ICD-10-CM | POA: Diagnosis not present

## 2016-04-20 DIAGNOSIS — R278 Other lack of coordination: Secondary | ICD-10-CM | POA: Diagnosis not present

## 2016-04-21 DIAGNOSIS — R278 Other lack of coordination: Secondary | ICD-10-CM | POA: Diagnosis not present

## 2016-04-21 DIAGNOSIS — F0390 Unspecified dementia without behavioral disturbance: Secondary | ICD-10-CM | POA: Diagnosis not present

## 2016-04-23 DIAGNOSIS — I10 Essential (primary) hypertension: Secondary | ICD-10-CM | POA: Diagnosis not present

## 2016-04-23 DIAGNOSIS — I5020 Unspecified systolic (congestive) heart failure: Secondary | ICD-10-CM | POA: Diagnosis not present

## 2016-04-23 DIAGNOSIS — I4891 Unspecified atrial fibrillation: Secondary | ICD-10-CM | POA: Diagnosis not present

## 2016-04-23 DIAGNOSIS — F0390 Unspecified dementia without behavioral disturbance: Secondary | ICD-10-CM | POA: Diagnosis not present

## 2016-04-23 DIAGNOSIS — R296 Repeated falls: Secondary | ICD-10-CM | POA: Diagnosis not present

## 2016-04-23 DIAGNOSIS — E039 Hypothyroidism, unspecified: Secondary | ICD-10-CM | POA: Diagnosis not present

## 2016-04-23 DIAGNOSIS — N183 Chronic kidney disease, stage 3 (moderate): Secondary | ICD-10-CM | POA: Diagnosis not present

## 2016-04-23 DIAGNOSIS — R260 Ataxic gait: Secondary | ICD-10-CM | POA: Diagnosis not present

## 2016-04-23 DIAGNOSIS — D649 Anemia, unspecified: Secondary | ICD-10-CM | POA: Diagnosis not present

## 2016-04-23 DIAGNOSIS — R278 Other lack of coordination: Secondary | ICD-10-CM | POA: Diagnosis not present

## 2016-04-24 DIAGNOSIS — D649 Anemia, unspecified: Secondary | ICD-10-CM | POA: Diagnosis not present

## 2016-04-24 DIAGNOSIS — F0390 Unspecified dementia without behavioral disturbance: Secondary | ICD-10-CM | POA: Diagnosis not present

## 2016-04-24 DIAGNOSIS — Z79899 Other long term (current) drug therapy: Secondary | ICD-10-CM | POA: Diagnosis not present

## 2016-04-24 DIAGNOSIS — I10 Essential (primary) hypertension: Secondary | ICD-10-CM | POA: Diagnosis not present

## 2016-04-24 DIAGNOSIS — R278 Other lack of coordination: Secondary | ICD-10-CM | POA: Diagnosis not present

## 2016-04-24 DIAGNOSIS — E785 Hyperlipidemia, unspecified: Secondary | ICD-10-CM | POA: Diagnosis not present

## 2016-04-27 DIAGNOSIS — R278 Other lack of coordination: Secondary | ICD-10-CM | POA: Diagnosis not present

## 2016-04-27 DIAGNOSIS — F0390 Unspecified dementia without behavioral disturbance: Secondary | ICD-10-CM | POA: Diagnosis not present

## 2016-04-28 DIAGNOSIS — R278 Other lack of coordination: Secondary | ICD-10-CM | POA: Diagnosis not present

## 2016-04-28 DIAGNOSIS — F0390 Unspecified dementia without behavioral disturbance: Secondary | ICD-10-CM | POA: Diagnosis not present

## 2016-04-29 DIAGNOSIS — R278 Other lack of coordination: Secondary | ICD-10-CM | POA: Diagnosis not present

## 2016-04-29 DIAGNOSIS — F0390 Unspecified dementia without behavioral disturbance: Secondary | ICD-10-CM | POA: Diagnosis not present

## 2016-04-30 DIAGNOSIS — F0390 Unspecified dementia without behavioral disturbance: Secondary | ICD-10-CM | POA: Diagnosis not present

## 2016-04-30 DIAGNOSIS — R278 Other lack of coordination: Secondary | ICD-10-CM | POA: Diagnosis not present

## 2016-05-01 DIAGNOSIS — R278 Other lack of coordination: Secondary | ICD-10-CM | POA: Diagnosis not present

## 2016-05-01 DIAGNOSIS — F0390 Unspecified dementia without behavioral disturbance: Secondary | ICD-10-CM | POA: Diagnosis not present

## 2016-05-04 DIAGNOSIS — R278 Other lack of coordination: Secondary | ICD-10-CM | POA: Diagnosis not present

## 2016-05-04 DIAGNOSIS — F0390 Unspecified dementia without behavioral disturbance: Secondary | ICD-10-CM | POA: Diagnosis not present

## 2016-05-28 DIAGNOSIS — E785 Hyperlipidemia, unspecified: Secondary | ICD-10-CM | POA: Diagnosis not present

## 2016-05-28 DIAGNOSIS — I10 Essential (primary) hypertension: Secondary | ICD-10-CM | POA: Diagnosis not present

## 2016-05-28 DIAGNOSIS — Z79899 Other long term (current) drug therapy: Secondary | ICD-10-CM | POA: Diagnosis not present

## 2016-05-28 DIAGNOSIS — D649 Anemia, unspecified: Secondary | ICD-10-CM | POA: Diagnosis not present

## 2016-06-04 DIAGNOSIS — I4891 Unspecified atrial fibrillation: Secondary | ICD-10-CM | POA: Diagnosis not present

## 2016-06-04 DIAGNOSIS — R0902 Hypoxemia: Secondary | ICD-10-CM | POA: Diagnosis not present

## 2016-06-04 DIAGNOSIS — I509 Heart failure, unspecified: Secondary | ICD-10-CM | POA: Diagnosis not present

## 2016-06-04 DIAGNOSIS — R0600 Dyspnea, unspecified: Secondary | ICD-10-CM | POA: Diagnosis not present

## 2016-06-04 DIAGNOSIS — F0390 Unspecified dementia without behavioral disturbance: Secondary | ICD-10-CM | POA: Diagnosis not present

## 2016-06-04 DIAGNOSIS — R062 Wheezing: Secondary | ICD-10-CM | POA: Diagnosis not present

## 2016-06-11 DIAGNOSIS — R0902 Hypoxemia: Secondary | ICD-10-CM | POA: Diagnosis not present

## 2016-06-11 DIAGNOSIS — N183 Chronic kidney disease, stage 3 (moderate): Secondary | ICD-10-CM | POA: Diagnosis not present

## 2016-06-11 DIAGNOSIS — I509 Heart failure, unspecified: Secondary | ICD-10-CM | POA: Diagnosis not present

## 2016-06-11 DIAGNOSIS — R062 Wheezing: Secondary | ICD-10-CM | POA: Diagnosis not present

## 2016-06-12 DIAGNOSIS — M6281 Muscle weakness (generalized): Secondary | ICD-10-CM | POA: Diagnosis not present

## 2016-06-12 DIAGNOSIS — F0390 Unspecified dementia without behavioral disturbance: Secondary | ICD-10-CM | POA: Diagnosis not present

## 2016-06-14 DIAGNOSIS — F0390 Unspecified dementia without behavioral disturbance: Secondary | ICD-10-CM | POA: Diagnosis not present

## 2016-06-14 DIAGNOSIS — M6281 Muscle weakness (generalized): Secondary | ICD-10-CM | POA: Diagnosis not present

## 2016-06-15 DIAGNOSIS — M6281 Muscle weakness (generalized): Secondary | ICD-10-CM | POA: Diagnosis not present

## 2016-06-15 DIAGNOSIS — F0390 Unspecified dementia without behavioral disturbance: Secondary | ICD-10-CM | POA: Diagnosis not present

## 2016-06-16 DIAGNOSIS — M6281 Muscle weakness (generalized): Secondary | ICD-10-CM | POA: Diagnosis not present

## 2016-06-16 DIAGNOSIS — F0390 Unspecified dementia without behavioral disturbance: Secondary | ICD-10-CM | POA: Diagnosis not present

## 2016-06-17 DIAGNOSIS — F0390 Unspecified dementia without behavioral disturbance: Secondary | ICD-10-CM | POA: Diagnosis not present

## 2016-06-17 DIAGNOSIS — M6281 Muscle weakness (generalized): Secondary | ICD-10-CM | POA: Diagnosis not present

## 2016-06-20 DIAGNOSIS — M6281 Muscle weakness (generalized): Secondary | ICD-10-CM | POA: Diagnosis not present

## 2016-06-20 DIAGNOSIS — F0390 Unspecified dementia without behavioral disturbance: Secondary | ICD-10-CM | POA: Diagnosis not present

## 2016-06-22 DIAGNOSIS — M6281 Muscle weakness (generalized): Secondary | ICD-10-CM | POA: Diagnosis not present

## 2016-06-22 DIAGNOSIS — F0390 Unspecified dementia without behavioral disturbance: Secondary | ICD-10-CM | POA: Diagnosis not present

## 2016-06-24 DIAGNOSIS — F0390 Unspecified dementia without behavioral disturbance: Secondary | ICD-10-CM | POA: Diagnosis not present

## 2016-06-24 DIAGNOSIS — M6281 Muscle weakness (generalized): Secondary | ICD-10-CM | POA: Diagnosis not present

## 2016-06-25 DIAGNOSIS — F0390 Unspecified dementia without behavioral disturbance: Secondary | ICD-10-CM | POA: Diagnosis not present

## 2016-06-25 DIAGNOSIS — M6281 Muscle weakness (generalized): Secondary | ICD-10-CM | POA: Diagnosis not present

## 2016-06-26 DIAGNOSIS — M6281 Muscle weakness (generalized): Secondary | ICD-10-CM | POA: Diagnosis not present

## 2016-06-26 DIAGNOSIS — F0390 Unspecified dementia without behavioral disturbance: Secondary | ICD-10-CM | POA: Diagnosis not present

## 2016-07-09 DIAGNOSIS — R2689 Other abnormalities of gait and mobility: Secondary | ICD-10-CM | POA: Diagnosis not present

## 2016-07-09 DIAGNOSIS — D6489 Other specified anemias: Secondary | ICD-10-CM | POA: Diagnosis not present

## 2016-07-09 DIAGNOSIS — I10 Essential (primary) hypertension: Secondary | ICD-10-CM | POA: Diagnosis not present

## 2016-07-09 DIAGNOSIS — N183 Chronic kidney disease, stage 3 (moderate): Secondary | ICD-10-CM | POA: Diagnosis not present

## 2016-07-09 DIAGNOSIS — E038 Other specified hypothyroidism: Secondary | ICD-10-CM | POA: Diagnosis not present

## 2016-07-09 DIAGNOSIS — Z8679 Personal history of other diseases of the circulatory system: Secondary | ICD-10-CM | POA: Diagnosis not present

## 2016-07-09 DIAGNOSIS — F0390 Unspecified dementia without behavioral disturbance: Secondary | ICD-10-CM | POA: Diagnosis not present

## 2016-07-09 DIAGNOSIS — I509 Heart failure, unspecified: Secondary | ICD-10-CM | POA: Diagnosis not present

## 2016-08-18 DIAGNOSIS — N183 Chronic kidney disease, stage 3 (moderate): Secondary | ICD-10-CM | POA: Diagnosis not present

## 2016-08-18 DIAGNOSIS — R062 Wheezing: Secondary | ICD-10-CM | POA: Diagnosis not present

## 2016-08-18 DIAGNOSIS — R05 Cough: Secondary | ICD-10-CM | POA: Diagnosis not present

## 2016-08-18 DIAGNOSIS — I509 Heart failure, unspecified: Secondary | ICD-10-CM | POA: Diagnosis not present

## 2016-08-18 DIAGNOSIS — I4891 Unspecified atrial fibrillation: Secondary | ICD-10-CM | POA: Diagnosis not present

## 2016-08-19 DIAGNOSIS — H04123 Dry eye syndrome of bilateral lacrimal glands: Secondary | ICD-10-CM | POA: Diagnosis not present

## 2016-08-19 DIAGNOSIS — H353121 Nonexudative age-related macular degeneration, left eye, early dry stage: Secondary | ICD-10-CM | POA: Diagnosis not present

## 2016-08-19 DIAGNOSIS — H353112 Nonexudative age-related macular degeneration, right eye, intermediate dry stage: Secondary | ICD-10-CM | POA: Diagnosis not present

## 2016-08-25 DIAGNOSIS — R0902 Hypoxemia: Secondary | ICD-10-CM | POA: Diagnosis not present

## 2016-08-25 DIAGNOSIS — I509 Heart failure, unspecified: Secondary | ICD-10-CM | POA: Diagnosis not present

## 2016-08-25 DIAGNOSIS — I4891 Unspecified atrial fibrillation: Secondary | ICD-10-CM | POA: Diagnosis not present

## 2016-08-25 DIAGNOSIS — N183 Chronic kidney disease, stage 3 (moderate): Secondary | ICD-10-CM | POA: Diagnosis not present

## 2016-09-02 DIAGNOSIS — R062 Wheezing: Secondary | ICD-10-CM | POA: Diagnosis not present

## 2016-09-02 DIAGNOSIS — N183 Chronic kidney disease, stage 3 (moderate): Secondary | ICD-10-CM | POA: Diagnosis not present

## 2016-09-02 DIAGNOSIS — I509 Heart failure, unspecified: Secondary | ICD-10-CM | POA: Diagnosis not present

## 2016-09-02 DIAGNOSIS — I4891 Unspecified atrial fibrillation: Secondary | ICD-10-CM | POA: Diagnosis not present

## 2016-09-03 DIAGNOSIS — I509 Heart failure, unspecified: Secondary | ICD-10-CM | POA: Diagnosis not present

## 2016-09-03 DIAGNOSIS — D649 Anemia, unspecified: Secondary | ICD-10-CM | POA: Diagnosis not present

## 2016-09-03 DIAGNOSIS — I10 Essential (primary) hypertension: Secondary | ICD-10-CM | POA: Diagnosis not present

## 2016-09-07 DIAGNOSIS — I509 Heart failure, unspecified: Secondary | ICD-10-CM | POA: Diagnosis not present

## 2016-09-07 DIAGNOSIS — N183 Chronic kidney disease, stage 3 (moderate): Secondary | ICD-10-CM | POA: Diagnosis not present

## 2016-09-07 DIAGNOSIS — I10 Essential (primary) hypertension: Secondary | ICD-10-CM | POA: Diagnosis not present

## 2016-09-07 DIAGNOSIS — F0280 Dementia in other diseases classified elsewhere without behavioral disturbance: Secondary | ICD-10-CM | POA: Diagnosis not present

## 2016-09-07 DIAGNOSIS — E038 Other specified hypothyroidism: Secondary | ICD-10-CM | POA: Diagnosis not present

## 2016-09-07 DIAGNOSIS — J449 Chronic obstructive pulmonary disease, unspecified: Secondary | ICD-10-CM | POA: Diagnosis not present

## 2016-09-07 DIAGNOSIS — D6489 Other specified anemias: Secondary | ICD-10-CM | POA: Diagnosis not present

## 2016-09-07 DIAGNOSIS — R2689 Other abnormalities of gait and mobility: Secondary | ICD-10-CM | POA: Diagnosis not present

## 2016-09-07 DIAGNOSIS — I480 Paroxysmal atrial fibrillation: Secondary | ICD-10-CM | POA: Diagnosis not present

## 2016-09-09 DIAGNOSIS — I509 Heart failure, unspecified: Secondary | ICD-10-CM | POA: Diagnosis not present

## 2016-09-09 DIAGNOSIS — I4891 Unspecified atrial fibrillation: Secondary | ICD-10-CM | POA: Diagnosis not present

## 2016-09-09 DIAGNOSIS — N183 Chronic kidney disease, stage 3 (moderate): Secondary | ICD-10-CM | POA: Diagnosis not present

## 2016-09-09 DIAGNOSIS — R296 Repeated falls: Secondary | ICD-10-CM | POA: Diagnosis not present

## 2016-09-16 DIAGNOSIS — E785 Hyperlipidemia, unspecified: Secondary | ICD-10-CM | POA: Diagnosis not present

## 2016-09-16 DIAGNOSIS — E119 Type 2 diabetes mellitus without complications: Secondary | ICD-10-CM | POA: Diagnosis not present

## 2016-09-16 DIAGNOSIS — E559 Vitamin D deficiency, unspecified: Secondary | ICD-10-CM | POA: Diagnosis not present

## 2016-10-02 DIAGNOSIS — I509 Heart failure, unspecified: Secondary | ICD-10-CM | POA: Diagnosis not present

## 2016-10-02 DIAGNOSIS — I4891 Unspecified atrial fibrillation: Secondary | ICD-10-CM | POA: Diagnosis not present

## 2016-10-02 DIAGNOSIS — I10 Essential (primary) hypertension: Secondary | ICD-10-CM | POA: Diagnosis not present

## 2016-10-02 DIAGNOSIS — T148XXD Other injury of unspecified body region, subsequent encounter: Secondary | ICD-10-CM | POA: Diagnosis not present

## 2016-10-31 DIAGNOSIS — E559 Vitamin D deficiency, unspecified: Secondary | ICD-10-CM | POA: Diagnosis not present

## 2016-11-11 DIAGNOSIS — Z66 Do not resuscitate: Secondary | ICD-10-CM | POA: Diagnosis not present

## 2016-11-11 DIAGNOSIS — J449 Chronic obstructive pulmonary disease, unspecified: Secondary | ICD-10-CM | POA: Diagnosis not present

## 2016-11-11 DIAGNOSIS — F0390 Unspecified dementia without behavioral disturbance: Secondary | ICD-10-CM | POA: Diagnosis not present

## 2016-11-11 DIAGNOSIS — N183 Chronic kidney disease, stage 3 (moderate): Secondary | ICD-10-CM | POA: Diagnosis not present

## 2016-11-11 DIAGNOSIS — I4891 Unspecified atrial fibrillation: Secondary | ICD-10-CM | POA: Diagnosis not present

## 2016-11-11 DIAGNOSIS — I509 Heart failure, unspecified: Secondary | ICD-10-CM | POA: Diagnosis not present

## 2016-11-21 DIAGNOSIS — E039 Hypothyroidism, unspecified: Secondary | ICD-10-CM | POA: Diagnosis not present

## 2016-11-21 DIAGNOSIS — K769 Liver disease, unspecified: Secondary | ICD-10-CM | POA: Diagnosis not present

## 2016-11-21 DIAGNOSIS — I10 Essential (primary) hypertension: Secondary | ICD-10-CM | POA: Diagnosis not present

## 2016-11-21 DIAGNOSIS — E785 Hyperlipidemia, unspecified: Secondary | ICD-10-CM | POA: Diagnosis not present

## 2016-11-21 DIAGNOSIS — E559 Vitamin D deficiency, unspecified: Secondary | ICD-10-CM | POA: Diagnosis not present

## 2016-11-21 DIAGNOSIS — D649 Anemia, unspecified: Secondary | ICD-10-CM | POA: Diagnosis not present

## 2016-11-22 DIAGNOSIS — R2681 Unsteadiness on feet: Secondary | ICD-10-CM | POA: Diagnosis not present

## 2016-11-22 DIAGNOSIS — M6281 Muscle weakness (generalized): Secondary | ICD-10-CM | POA: Diagnosis not present

## 2016-11-22 DIAGNOSIS — R260 Ataxic gait: Secondary | ICD-10-CM | POA: Diagnosis not present

## 2016-11-22 DIAGNOSIS — F0390 Unspecified dementia without behavioral disturbance: Secondary | ICD-10-CM | POA: Diagnosis not present

## 2016-11-22 DIAGNOSIS — I4891 Unspecified atrial fibrillation: Secondary | ICD-10-CM | POA: Diagnosis not present

## 2016-11-22 DIAGNOSIS — R278 Other lack of coordination: Secondary | ICD-10-CM | POA: Diagnosis not present

## 2016-11-22 DIAGNOSIS — E039 Hypothyroidism, unspecified: Secondary | ICD-10-CM | POA: Diagnosis not present

## 2016-11-22 DIAGNOSIS — I130 Hypertensive heart and chronic kidney disease with heart failure and stage 1 through stage 4 chronic kidney disease, or unspecified chronic kidney disease: Secondary | ICD-10-CM | POA: Diagnosis not present

## 2016-11-22 DIAGNOSIS — K219 Gastro-esophageal reflux disease without esophagitis: Secondary | ICD-10-CM | POA: Diagnosis not present

## 2016-11-23 DIAGNOSIS — R278 Other lack of coordination: Secondary | ICD-10-CM | POA: Diagnosis not present

## 2016-11-23 DIAGNOSIS — F0390 Unspecified dementia without behavioral disturbance: Secondary | ICD-10-CM | POA: Diagnosis not present

## 2016-11-23 DIAGNOSIS — I4891 Unspecified atrial fibrillation: Secondary | ICD-10-CM | POA: Diagnosis not present

## 2016-11-23 DIAGNOSIS — K219 Gastro-esophageal reflux disease without esophagitis: Secondary | ICD-10-CM | POA: Diagnosis not present

## 2016-11-23 DIAGNOSIS — E039 Hypothyroidism, unspecified: Secondary | ICD-10-CM | POA: Diagnosis not present

## 2016-11-23 DIAGNOSIS — R260 Ataxic gait: Secondary | ICD-10-CM | POA: Diagnosis not present

## 2016-11-24 DIAGNOSIS — I4891 Unspecified atrial fibrillation: Secondary | ICD-10-CM | POA: Diagnosis not present

## 2016-11-24 DIAGNOSIS — E039 Hypothyroidism, unspecified: Secondary | ICD-10-CM | POA: Diagnosis not present

## 2016-11-24 DIAGNOSIS — K219 Gastro-esophageal reflux disease without esophagitis: Secondary | ICD-10-CM | POA: Diagnosis not present

## 2016-11-24 DIAGNOSIS — R278 Other lack of coordination: Secondary | ICD-10-CM | POA: Diagnosis not present

## 2016-11-24 DIAGNOSIS — F0390 Unspecified dementia without behavioral disturbance: Secondary | ICD-10-CM | POA: Diagnosis not present

## 2016-11-24 DIAGNOSIS — R260 Ataxic gait: Secondary | ICD-10-CM | POA: Diagnosis not present

## 2016-11-25 DIAGNOSIS — F0390 Unspecified dementia without behavioral disturbance: Secondary | ICD-10-CM | POA: Diagnosis not present

## 2016-11-25 DIAGNOSIS — R260 Ataxic gait: Secondary | ICD-10-CM | POA: Diagnosis not present

## 2016-11-25 DIAGNOSIS — E039 Hypothyroidism, unspecified: Secondary | ICD-10-CM | POA: Diagnosis not present

## 2016-11-25 DIAGNOSIS — R278 Other lack of coordination: Secondary | ICD-10-CM | POA: Diagnosis not present

## 2016-11-25 DIAGNOSIS — K219 Gastro-esophageal reflux disease without esophagitis: Secondary | ICD-10-CM | POA: Diagnosis not present

## 2016-11-25 DIAGNOSIS — I4891 Unspecified atrial fibrillation: Secondary | ICD-10-CM | POA: Diagnosis not present

## 2016-11-27 DIAGNOSIS — E039 Hypothyroidism, unspecified: Secondary | ICD-10-CM | POA: Diagnosis not present

## 2016-11-27 DIAGNOSIS — R278 Other lack of coordination: Secondary | ICD-10-CM | POA: Diagnosis not present

## 2016-11-27 DIAGNOSIS — K219 Gastro-esophageal reflux disease without esophagitis: Secondary | ICD-10-CM | POA: Diagnosis not present

## 2016-11-27 DIAGNOSIS — R260 Ataxic gait: Secondary | ICD-10-CM | POA: Diagnosis not present

## 2016-11-27 DIAGNOSIS — F0390 Unspecified dementia without behavioral disturbance: Secondary | ICD-10-CM | POA: Diagnosis not present

## 2016-11-27 DIAGNOSIS — I4891 Unspecified atrial fibrillation: Secondary | ICD-10-CM | POA: Diagnosis not present

## 2016-11-30 DIAGNOSIS — R278 Other lack of coordination: Secondary | ICD-10-CM | POA: Diagnosis not present

## 2016-11-30 DIAGNOSIS — I4891 Unspecified atrial fibrillation: Secondary | ICD-10-CM | POA: Diagnosis not present

## 2016-11-30 DIAGNOSIS — R260 Ataxic gait: Secondary | ICD-10-CM | POA: Diagnosis not present

## 2016-11-30 DIAGNOSIS — K219 Gastro-esophageal reflux disease without esophagitis: Secondary | ICD-10-CM | POA: Diagnosis not present

## 2016-11-30 DIAGNOSIS — E039 Hypothyroidism, unspecified: Secondary | ICD-10-CM | POA: Diagnosis not present

## 2016-11-30 DIAGNOSIS — F0390 Unspecified dementia without behavioral disturbance: Secondary | ICD-10-CM | POA: Diagnosis not present

## 2016-12-01 DIAGNOSIS — R278 Other lack of coordination: Secondary | ICD-10-CM | POA: Diagnosis not present

## 2016-12-01 DIAGNOSIS — R260 Ataxic gait: Secondary | ICD-10-CM | POA: Diagnosis not present

## 2016-12-01 DIAGNOSIS — K219 Gastro-esophageal reflux disease without esophagitis: Secondary | ICD-10-CM | POA: Diagnosis not present

## 2016-12-01 DIAGNOSIS — I4891 Unspecified atrial fibrillation: Secondary | ICD-10-CM | POA: Diagnosis not present

## 2016-12-01 DIAGNOSIS — E039 Hypothyroidism, unspecified: Secondary | ICD-10-CM | POA: Diagnosis not present

## 2016-12-01 DIAGNOSIS — F0390 Unspecified dementia without behavioral disturbance: Secondary | ICD-10-CM | POA: Diagnosis not present

## 2016-12-02 DIAGNOSIS — E039 Hypothyroidism, unspecified: Secondary | ICD-10-CM | POA: Diagnosis not present

## 2016-12-02 DIAGNOSIS — R278 Other lack of coordination: Secondary | ICD-10-CM | POA: Diagnosis not present

## 2016-12-02 DIAGNOSIS — R260 Ataxic gait: Secondary | ICD-10-CM | POA: Diagnosis not present

## 2016-12-02 DIAGNOSIS — I4891 Unspecified atrial fibrillation: Secondary | ICD-10-CM | POA: Diagnosis not present

## 2016-12-02 DIAGNOSIS — F0390 Unspecified dementia without behavioral disturbance: Secondary | ICD-10-CM | POA: Diagnosis not present

## 2016-12-02 DIAGNOSIS — K219 Gastro-esophageal reflux disease without esophagitis: Secondary | ICD-10-CM | POA: Diagnosis not present

## 2016-12-03 DIAGNOSIS — I4891 Unspecified atrial fibrillation: Secondary | ICD-10-CM | POA: Diagnosis not present

## 2016-12-03 DIAGNOSIS — F0390 Unspecified dementia without behavioral disturbance: Secondary | ICD-10-CM | POA: Diagnosis not present

## 2016-12-03 DIAGNOSIS — K219 Gastro-esophageal reflux disease without esophagitis: Secondary | ICD-10-CM | POA: Diagnosis not present

## 2016-12-03 DIAGNOSIS — E039 Hypothyroidism, unspecified: Secondary | ICD-10-CM | POA: Diagnosis not present

## 2016-12-03 DIAGNOSIS — R260 Ataxic gait: Secondary | ICD-10-CM | POA: Diagnosis not present

## 2016-12-03 DIAGNOSIS — R278 Other lack of coordination: Secondary | ICD-10-CM | POA: Diagnosis not present

## 2016-12-04 DIAGNOSIS — R260 Ataxic gait: Secondary | ICD-10-CM | POA: Diagnosis not present

## 2016-12-04 DIAGNOSIS — E039 Hypothyroidism, unspecified: Secondary | ICD-10-CM | POA: Diagnosis not present

## 2016-12-04 DIAGNOSIS — R278 Other lack of coordination: Secondary | ICD-10-CM | POA: Diagnosis not present

## 2016-12-04 DIAGNOSIS — I4891 Unspecified atrial fibrillation: Secondary | ICD-10-CM | POA: Diagnosis not present

## 2016-12-04 DIAGNOSIS — K219 Gastro-esophageal reflux disease without esophagitis: Secondary | ICD-10-CM | POA: Diagnosis not present

## 2016-12-04 DIAGNOSIS — F0390 Unspecified dementia without behavioral disturbance: Secondary | ICD-10-CM | POA: Diagnosis not present

## 2016-12-07 DIAGNOSIS — K219 Gastro-esophageal reflux disease without esophagitis: Secondary | ICD-10-CM | POA: Diagnosis not present

## 2016-12-07 DIAGNOSIS — I4891 Unspecified atrial fibrillation: Secondary | ICD-10-CM | POA: Diagnosis not present

## 2016-12-07 DIAGNOSIS — F0390 Unspecified dementia without behavioral disturbance: Secondary | ICD-10-CM | POA: Diagnosis not present

## 2016-12-07 DIAGNOSIS — R260 Ataxic gait: Secondary | ICD-10-CM | POA: Diagnosis not present

## 2016-12-07 DIAGNOSIS — E039 Hypothyroidism, unspecified: Secondary | ICD-10-CM | POA: Diagnosis not present

## 2016-12-07 DIAGNOSIS — R278 Other lack of coordination: Secondary | ICD-10-CM | POA: Diagnosis not present

## 2016-12-28 DIAGNOSIS — R260 Ataxic gait: Secondary | ICD-10-CM | POA: Diagnosis not present

## 2016-12-28 DIAGNOSIS — E039 Hypothyroidism, unspecified: Secondary | ICD-10-CM | POA: Diagnosis not present

## 2016-12-28 DIAGNOSIS — R2681 Unsteadiness on feet: Secondary | ICD-10-CM | POA: Diagnosis not present

## 2016-12-28 DIAGNOSIS — R29898 Other symptoms and signs involving the musculoskeletal system: Secondary | ICD-10-CM | POA: Diagnosis not present

## 2016-12-28 DIAGNOSIS — I4891 Unspecified atrial fibrillation: Secondary | ICD-10-CM | POA: Diagnosis not present

## 2016-12-28 DIAGNOSIS — I130 Hypertensive heart and chronic kidney disease with heart failure and stage 1 through stage 4 chronic kidney disease, or unspecified chronic kidney disease: Secondary | ICD-10-CM | POA: Diagnosis not present

## 2016-12-28 DIAGNOSIS — J449 Chronic obstructive pulmonary disease, unspecified: Secondary | ICD-10-CM | POA: Diagnosis not present

## 2016-12-28 DIAGNOSIS — M6281 Muscle weakness (generalized): Secondary | ICD-10-CM | POA: Diagnosis not present

## 2016-12-28 DIAGNOSIS — F0390 Unspecified dementia without behavioral disturbance: Secondary | ICD-10-CM | POA: Diagnosis not present

## 2016-12-28 DIAGNOSIS — Z9181 History of falling: Secondary | ICD-10-CM | POA: Diagnosis not present

## 2016-12-29 DIAGNOSIS — R29898 Other symptoms and signs involving the musculoskeletal system: Secondary | ICD-10-CM | POA: Diagnosis not present

## 2016-12-29 DIAGNOSIS — I4891 Unspecified atrial fibrillation: Secondary | ICD-10-CM | POA: Diagnosis not present

## 2016-12-29 DIAGNOSIS — Z9181 History of falling: Secondary | ICD-10-CM | POA: Diagnosis not present

## 2016-12-29 DIAGNOSIS — E039 Hypothyroidism, unspecified: Secondary | ICD-10-CM | POA: Diagnosis not present

## 2016-12-29 DIAGNOSIS — M6281 Muscle weakness (generalized): Secondary | ICD-10-CM | POA: Diagnosis not present

## 2016-12-29 DIAGNOSIS — R260 Ataxic gait: Secondary | ICD-10-CM | POA: Diagnosis not present

## 2016-12-30 DIAGNOSIS — E039 Hypothyroidism, unspecified: Secondary | ICD-10-CM | POA: Diagnosis not present

## 2016-12-30 DIAGNOSIS — M6281 Muscle weakness (generalized): Secondary | ICD-10-CM | POA: Diagnosis not present

## 2016-12-30 DIAGNOSIS — I4891 Unspecified atrial fibrillation: Secondary | ICD-10-CM | POA: Diagnosis not present

## 2016-12-30 DIAGNOSIS — R29898 Other symptoms and signs involving the musculoskeletal system: Secondary | ICD-10-CM | POA: Diagnosis not present

## 2016-12-30 DIAGNOSIS — R260 Ataxic gait: Secondary | ICD-10-CM | POA: Diagnosis not present

## 2016-12-30 DIAGNOSIS — Z9181 History of falling: Secondary | ICD-10-CM | POA: Diagnosis not present

## 2016-12-31 DIAGNOSIS — M6281 Muscle weakness (generalized): Secondary | ICD-10-CM | POA: Diagnosis not present

## 2016-12-31 DIAGNOSIS — E039 Hypothyroidism, unspecified: Secondary | ICD-10-CM | POA: Diagnosis not present

## 2016-12-31 DIAGNOSIS — I4891 Unspecified atrial fibrillation: Secondary | ICD-10-CM | POA: Diagnosis not present

## 2016-12-31 DIAGNOSIS — Z9181 History of falling: Secondary | ICD-10-CM | POA: Diagnosis not present

## 2016-12-31 DIAGNOSIS — R260 Ataxic gait: Secondary | ICD-10-CM | POA: Diagnosis not present

## 2016-12-31 DIAGNOSIS — R29898 Other symptoms and signs involving the musculoskeletal system: Secondary | ICD-10-CM | POA: Diagnosis not present

## 2017-01-02 DIAGNOSIS — R260 Ataxic gait: Secondary | ICD-10-CM | POA: Diagnosis not present

## 2017-01-02 DIAGNOSIS — M6281 Muscle weakness (generalized): Secondary | ICD-10-CM | POA: Diagnosis not present

## 2017-01-02 DIAGNOSIS — I4891 Unspecified atrial fibrillation: Secondary | ICD-10-CM | POA: Diagnosis not present

## 2017-01-02 DIAGNOSIS — E039 Hypothyroidism, unspecified: Secondary | ICD-10-CM | POA: Diagnosis not present

## 2017-01-02 DIAGNOSIS — R29898 Other symptoms and signs involving the musculoskeletal system: Secondary | ICD-10-CM | POA: Diagnosis not present

## 2017-01-02 DIAGNOSIS — Z9181 History of falling: Secondary | ICD-10-CM | POA: Diagnosis not present

## 2017-01-04 DIAGNOSIS — R260 Ataxic gait: Secondary | ICD-10-CM | POA: Diagnosis not present

## 2017-01-04 DIAGNOSIS — M6281 Muscle weakness (generalized): Secondary | ICD-10-CM | POA: Diagnosis not present

## 2017-01-04 DIAGNOSIS — R29898 Other symptoms and signs involving the musculoskeletal system: Secondary | ICD-10-CM | POA: Diagnosis not present

## 2017-01-04 DIAGNOSIS — I4891 Unspecified atrial fibrillation: Secondary | ICD-10-CM | POA: Diagnosis not present

## 2017-01-04 DIAGNOSIS — Z9181 History of falling: Secondary | ICD-10-CM | POA: Diagnosis not present

## 2017-01-04 DIAGNOSIS — E039 Hypothyroidism, unspecified: Secondary | ICD-10-CM | POA: Diagnosis not present

## 2017-01-05 DIAGNOSIS — R260 Ataxic gait: Secondary | ICD-10-CM | POA: Diagnosis not present

## 2017-01-05 DIAGNOSIS — M6281 Muscle weakness (generalized): Secondary | ICD-10-CM | POA: Diagnosis not present

## 2017-01-05 DIAGNOSIS — E039 Hypothyroidism, unspecified: Secondary | ICD-10-CM | POA: Diagnosis not present

## 2017-01-05 DIAGNOSIS — R29898 Other symptoms and signs involving the musculoskeletal system: Secondary | ICD-10-CM | POA: Diagnosis not present

## 2017-01-05 DIAGNOSIS — I4891 Unspecified atrial fibrillation: Secondary | ICD-10-CM | POA: Diagnosis not present

## 2017-01-05 DIAGNOSIS — Z9181 History of falling: Secondary | ICD-10-CM | POA: Diagnosis not present

## 2017-01-06 DIAGNOSIS — E039 Hypothyroidism, unspecified: Secondary | ICD-10-CM | POA: Diagnosis not present

## 2017-01-06 DIAGNOSIS — M6281 Muscle weakness (generalized): Secondary | ICD-10-CM | POA: Diagnosis not present

## 2017-01-06 DIAGNOSIS — J449 Chronic obstructive pulmonary disease, unspecified: Secondary | ICD-10-CM | POA: Diagnosis not present

## 2017-01-06 DIAGNOSIS — Z9181 History of falling: Secondary | ICD-10-CM | POA: Diagnosis not present

## 2017-01-06 DIAGNOSIS — I4891 Unspecified atrial fibrillation: Secondary | ICD-10-CM | POA: Diagnosis not present

## 2017-01-06 DIAGNOSIS — I509 Heart failure, unspecified: Secondary | ICD-10-CM | POA: Diagnosis not present

## 2017-01-06 DIAGNOSIS — F0390 Unspecified dementia without behavioral disturbance: Secondary | ICD-10-CM | POA: Diagnosis not present

## 2017-01-06 DIAGNOSIS — R260 Ataxic gait: Secondary | ICD-10-CM | POA: Diagnosis not present

## 2017-01-06 DIAGNOSIS — R29898 Other symptoms and signs involving the musculoskeletal system: Secondary | ICD-10-CM | POA: Diagnosis not present

## 2017-01-07 DIAGNOSIS — Z9181 History of falling: Secondary | ICD-10-CM | POA: Diagnosis not present

## 2017-01-07 DIAGNOSIS — I4891 Unspecified atrial fibrillation: Secondary | ICD-10-CM | POA: Diagnosis not present

## 2017-01-07 DIAGNOSIS — R29898 Other symptoms and signs involving the musculoskeletal system: Secondary | ICD-10-CM | POA: Diagnosis not present

## 2017-01-07 DIAGNOSIS — M6281 Muscle weakness (generalized): Secondary | ICD-10-CM | POA: Diagnosis not present

## 2017-01-07 DIAGNOSIS — R260 Ataxic gait: Secondary | ICD-10-CM | POA: Diagnosis not present

## 2017-01-07 DIAGNOSIS — E039 Hypothyroidism, unspecified: Secondary | ICD-10-CM | POA: Diagnosis not present

## 2017-01-08 DIAGNOSIS — R29898 Other symptoms and signs involving the musculoskeletal system: Secondary | ICD-10-CM | POA: Diagnosis not present

## 2017-01-08 DIAGNOSIS — Z9181 History of falling: Secondary | ICD-10-CM | POA: Diagnosis not present

## 2017-01-08 DIAGNOSIS — E039 Hypothyroidism, unspecified: Secondary | ICD-10-CM | POA: Diagnosis not present

## 2017-01-08 DIAGNOSIS — R260 Ataxic gait: Secondary | ICD-10-CM | POA: Diagnosis not present

## 2017-01-08 DIAGNOSIS — M6281 Muscle weakness (generalized): Secondary | ICD-10-CM | POA: Diagnosis not present

## 2017-01-08 DIAGNOSIS — I4891 Unspecified atrial fibrillation: Secondary | ICD-10-CM | POA: Diagnosis not present

## 2017-01-12 DIAGNOSIS — R29898 Other symptoms and signs involving the musculoskeletal system: Secondary | ICD-10-CM | POA: Diagnosis not present

## 2017-01-12 DIAGNOSIS — M6281 Muscle weakness (generalized): Secondary | ICD-10-CM | POA: Diagnosis not present

## 2017-01-12 DIAGNOSIS — Z9181 History of falling: Secondary | ICD-10-CM | POA: Diagnosis not present

## 2017-01-12 DIAGNOSIS — R260 Ataxic gait: Secondary | ICD-10-CM | POA: Diagnosis not present

## 2017-01-12 DIAGNOSIS — I4891 Unspecified atrial fibrillation: Secondary | ICD-10-CM | POA: Diagnosis not present

## 2017-01-12 DIAGNOSIS — E039 Hypothyroidism, unspecified: Secondary | ICD-10-CM | POA: Diagnosis not present

## 2017-01-13 DIAGNOSIS — R29898 Other symptoms and signs involving the musculoskeletal system: Secondary | ICD-10-CM | POA: Diagnosis not present

## 2017-01-13 DIAGNOSIS — I4891 Unspecified atrial fibrillation: Secondary | ICD-10-CM | POA: Diagnosis not present

## 2017-01-13 DIAGNOSIS — Z9181 History of falling: Secondary | ICD-10-CM | POA: Diagnosis not present

## 2017-01-13 DIAGNOSIS — M6281 Muscle weakness (generalized): Secondary | ICD-10-CM | POA: Diagnosis not present

## 2017-01-13 DIAGNOSIS — R260 Ataxic gait: Secondary | ICD-10-CM | POA: Diagnosis not present

## 2017-01-13 DIAGNOSIS — E039 Hypothyroidism, unspecified: Secondary | ICD-10-CM | POA: Diagnosis not present

## 2017-01-14 DIAGNOSIS — E039 Hypothyroidism, unspecified: Secondary | ICD-10-CM | POA: Diagnosis not present

## 2017-01-14 DIAGNOSIS — R29898 Other symptoms and signs involving the musculoskeletal system: Secondary | ICD-10-CM | POA: Diagnosis not present

## 2017-01-14 DIAGNOSIS — M6281 Muscle weakness (generalized): Secondary | ICD-10-CM | POA: Diagnosis not present

## 2017-01-14 DIAGNOSIS — Z9181 History of falling: Secondary | ICD-10-CM | POA: Diagnosis not present

## 2017-01-14 DIAGNOSIS — R260 Ataxic gait: Secondary | ICD-10-CM | POA: Diagnosis not present

## 2017-01-14 DIAGNOSIS — I4891 Unspecified atrial fibrillation: Secondary | ICD-10-CM | POA: Diagnosis not present

## 2017-01-15 DIAGNOSIS — I4891 Unspecified atrial fibrillation: Secondary | ICD-10-CM | POA: Diagnosis not present

## 2017-01-15 DIAGNOSIS — R260 Ataxic gait: Secondary | ICD-10-CM | POA: Diagnosis not present

## 2017-01-15 DIAGNOSIS — M6281 Muscle weakness (generalized): Secondary | ICD-10-CM | POA: Diagnosis not present

## 2017-01-15 DIAGNOSIS — E039 Hypothyroidism, unspecified: Secondary | ICD-10-CM | POA: Diagnosis not present

## 2017-01-15 DIAGNOSIS — R29898 Other symptoms and signs involving the musculoskeletal system: Secondary | ICD-10-CM | POA: Diagnosis not present

## 2017-01-15 DIAGNOSIS — Z9181 History of falling: Secondary | ICD-10-CM | POA: Diagnosis not present

## 2017-01-16 DIAGNOSIS — E039 Hypothyroidism, unspecified: Secondary | ICD-10-CM | POA: Diagnosis not present

## 2017-01-16 DIAGNOSIS — Z9181 History of falling: Secondary | ICD-10-CM | POA: Diagnosis not present

## 2017-01-16 DIAGNOSIS — R29898 Other symptoms and signs involving the musculoskeletal system: Secondary | ICD-10-CM | POA: Diagnosis not present

## 2017-01-16 DIAGNOSIS — M6281 Muscle weakness (generalized): Secondary | ICD-10-CM | POA: Diagnosis not present

## 2017-01-16 DIAGNOSIS — R260 Ataxic gait: Secondary | ICD-10-CM | POA: Diagnosis not present

## 2017-01-16 DIAGNOSIS — I4891 Unspecified atrial fibrillation: Secondary | ICD-10-CM | POA: Diagnosis not present

## 2017-01-18 DIAGNOSIS — E039 Hypothyroidism, unspecified: Secondary | ICD-10-CM | POA: Diagnosis not present

## 2017-01-18 DIAGNOSIS — R29898 Other symptoms and signs involving the musculoskeletal system: Secondary | ICD-10-CM | POA: Diagnosis not present

## 2017-01-18 DIAGNOSIS — I4891 Unspecified atrial fibrillation: Secondary | ICD-10-CM | POA: Diagnosis not present

## 2017-01-18 DIAGNOSIS — Z9181 History of falling: Secondary | ICD-10-CM | POA: Diagnosis not present

## 2017-01-18 DIAGNOSIS — R260 Ataxic gait: Secondary | ICD-10-CM | POA: Diagnosis not present

## 2017-01-18 DIAGNOSIS — M6281 Muscle weakness (generalized): Secondary | ICD-10-CM | POA: Diagnosis not present

## 2017-01-19 DIAGNOSIS — I130 Hypertensive heart and chronic kidney disease with heart failure and stage 1 through stage 4 chronic kidney disease, or unspecified chronic kidney disease: Secondary | ICD-10-CM | POA: Diagnosis not present

## 2017-01-19 DIAGNOSIS — F0390 Unspecified dementia without behavioral disturbance: Secondary | ICD-10-CM | POA: Diagnosis not present

## 2017-01-19 DIAGNOSIS — Z9181 History of falling: Secondary | ICD-10-CM | POA: Diagnosis not present

## 2017-01-19 DIAGNOSIS — M6281 Muscle weakness (generalized): Secondary | ICD-10-CM | POA: Diagnosis not present

## 2017-01-19 DIAGNOSIS — J449 Chronic obstructive pulmonary disease, unspecified: Secondary | ICD-10-CM | POA: Diagnosis not present

## 2017-01-19 DIAGNOSIS — R41841 Cognitive communication deficit: Secondary | ICD-10-CM | POA: Diagnosis not present

## 2017-01-19 DIAGNOSIS — R2681 Unsteadiness on feet: Secondary | ICD-10-CM | POA: Diagnosis not present

## 2017-01-19 DIAGNOSIS — E039 Hypothyroidism, unspecified: Secondary | ICD-10-CM | POA: Diagnosis not present

## 2017-01-19 DIAGNOSIS — I4891 Unspecified atrial fibrillation: Secondary | ICD-10-CM | POA: Diagnosis not present

## 2017-01-19 DIAGNOSIS — R260 Ataxic gait: Secondary | ICD-10-CM | POA: Diagnosis not present

## 2017-01-19 DIAGNOSIS — R29898 Other symptoms and signs involving the musculoskeletal system: Secondary | ICD-10-CM | POA: Diagnosis not present

## 2017-02-05 DIAGNOSIS — R260 Ataxic gait: Secondary | ICD-10-CM | POA: Diagnosis not present

## 2017-02-05 DIAGNOSIS — M6281 Muscle weakness (generalized): Secondary | ICD-10-CM | POA: Diagnosis not present

## 2017-02-05 DIAGNOSIS — R41841 Cognitive communication deficit: Secondary | ICD-10-CM | POA: Diagnosis not present

## 2017-02-05 DIAGNOSIS — Z9181 History of falling: Secondary | ICD-10-CM | POA: Diagnosis not present

## 2017-02-05 DIAGNOSIS — I4891 Unspecified atrial fibrillation: Secondary | ICD-10-CM | POA: Diagnosis not present

## 2017-02-05 DIAGNOSIS — R29898 Other symptoms and signs involving the musculoskeletal system: Secondary | ICD-10-CM | POA: Diagnosis not present

## 2017-02-08 DIAGNOSIS — I4891 Unspecified atrial fibrillation: Secondary | ICD-10-CM | POA: Diagnosis not present

## 2017-02-08 DIAGNOSIS — M6281 Muscle weakness (generalized): Secondary | ICD-10-CM | POA: Diagnosis not present

## 2017-02-08 DIAGNOSIS — R29898 Other symptoms and signs involving the musculoskeletal system: Secondary | ICD-10-CM | POA: Diagnosis not present

## 2017-02-08 DIAGNOSIS — Z9181 History of falling: Secondary | ICD-10-CM | POA: Diagnosis not present

## 2017-02-08 DIAGNOSIS — R260 Ataxic gait: Secondary | ICD-10-CM | POA: Diagnosis not present

## 2017-02-08 DIAGNOSIS — R41841 Cognitive communication deficit: Secondary | ICD-10-CM | POA: Diagnosis not present

## 2017-02-09 DIAGNOSIS — I4891 Unspecified atrial fibrillation: Secondary | ICD-10-CM | POA: Diagnosis not present

## 2017-02-09 DIAGNOSIS — R260 Ataxic gait: Secondary | ICD-10-CM | POA: Diagnosis not present

## 2017-02-09 DIAGNOSIS — R29898 Other symptoms and signs involving the musculoskeletal system: Secondary | ICD-10-CM | POA: Diagnosis not present

## 2017-02-09 DIAGNOSIS — Z9181 History of falling: Secondary | ICD-10-CM | POA: Diagnosis not present

## 2017-02-09 DIAGNOSIS — M6281 Muscle weakness (generalized): Secondary | ICD-10-CM | POA: Diagnosis not present

## 2017-02-09 DIAGNOSIS — R41841 Cognitive communication deficit: Secondary | ICD-10-CM | POA: Diagnosis not present

## 2017-02-10 DIAGNOSIS — M6281 Muscle weakness (generalized): Secondary | ICD-10-CM | POA: Diagnosis not present

## 2017-02-10 DIAGNOSIS — R260 Ataxic gait: Secondary | ICD-10-CM | POA: Diagnosis not present

## 2017-02-10 DIAGNOSIS — R41841 Cognitive communication deficit: Secondary | ICD-10-CM | POA: Diagnosis not present

## 2017-02-10 DIAGNOSIS — Z9181 History of falling: Secondary | ICD-10-CM | POA: Diagnosis not present

## 2017-02-10 DIAGNOSIS — R29898 Other symptoms and signs involving the musculoskeletal system: Secondary | ICD-10-CM | POA: Diagnosis not present

## 2017-02-10 DIAGNOSIS — I4891 Unspecified atrial fibrillation: Secondary | ICD-10-CM | POA: Diagnosis not present

## 2017-02-11 DIAGNOSIS — R29898 Other symptoms and signs involving the musculoskeletal system: Secondary | ICD-10-CM | POA: Diagnosis not present

## 2017-02-11 DIAGNOSIS — Z9181 History of falling: Secondary | ICD-10-CM | POA: Diagnosis not present

## 2017-02-11 DIAGNOSIS — R260 Ataxic gait: Secondary | ICD-10-CM | POA: Diagnosis not present

## 2017-02-11 DIAGNOSIS — M6281 Muscle weakness (generalized): Secondary | ICD-10-CM | POA: Diagnosis not present

## 2017-02-11 DIAGNOSIS — R41841 Cognitive communication deficit: Secondary | ICD-10-CM | POA: Diagnosis not present

## 2017-02-11 DIAGNOSIS — I4891 Unspecified atrial fibrillation: Secondary | ICD-10-CM | POA: Diagnosis not present

## 2017-02-12 DIAGNOSIS — R29898 Other symptoms and signs involving the musculoskeletal system: Secondary | ICD-10-CM | POA: Diagnosis not present

## 2017-02-12 DIAGNOSIS — R41841 Cognitive communication deficit: Secondary | ICD-10-CM | POA: Diagnosis not present

## 2017-02-12 DIAGNOSIS — R260 Ataxic gait: Secondary | ICD-10-CM | POA: Diagnosis not present

## 2017-02-12 DIAGNOSIS — Z9181 History of falling: Secondary | ICD-10-CM | POA: Diagnosis not present

## 2017-02-12 DIAGNOSIS — M6281 Muscle weakness (generalized): Secondary | ICD-10-CM | POA: Diagnosis not present

## 2017-02-12 DIAGNOSIS — I4891 Unspecified atrial fibrillation: Secondary | ICD-10-CM | POA: Diagnosis not present

## 2017-02-15 DIAGNOSIS — I4891 Unspecified atrial fibrillation: Secondary | ICD-10-CM | POA: Diagnosis not present

## 2017-02-15 DIAGNOSIS — R41841 Cognitive communication deficit: Secondary | ICD-10-CM | POA: Diagnosis not present

## 2017-02-15 DIAGNOSIS — R29898 Other symptoms and signs involving the musculoskeletal system: Secondary | ICD-10-CM | POA: Diagnosis not present

## 2017-02-15 DIAGNOSIS — Z9181 History of falling: Secondary | ICD-10-CM | POA: Diagnosis not present

## 2017-02-15 DIAGNOSIS — M6281 Muscle weakness (generalized): Secondary | ICD-10-CM | POA: Diagnosis not present

## 2017-02-15 DIAGNOSIS — R260 Ataxic gait: Secondary | ICD-10-CM | POA: Diagnosis not present

## 2017-02-16 DIAGNOSIS — I4891 Unspecified atrial fibrillation: Secondary | ICD-10-CM | POA: Diagnosis not present

## 2017-02-16 DIAGNOSIS — R41841 Cognitive communication deficit: Secondary | ICD-10-CM | POA: Diagnosis not present

## 2017-02-16 DIAGNOSIS — Z9181 History of falling: Secondary | ICD-10-CM | POA: Diagnosis not present

## 2017-02-16 DIAGNOSIS — R260 Ataxic gait: Secondary | ICD-10-CM | POA: Diagnosis not present

## 2017-02-16 DIAGNOSIS — M6281 Muscle weakness (generalized): Secondary | ICD-10-CM | POA: Diagnosis not present

## 2017-02-16 DIAGNOSIS — R29898 Other symptoms and signs involving the musculoskeletal system: Secondary | ICD-10-CM | POA: Diagnosis not present

## 2017-02-17 DIAGNOSIS — R260 Ataxic gait: Secondary | ICD-10-CM | POA: Diagnosis not present

## 2017-02-17 DIAGNOSIS — Z9181 History of falling: Secondary | ICD-10-CM | POA: Diagnosis not present

## 2017-02-17 DIAGNOSIS — R41841 Cognitive communication deficit: Secondary | ICD-10-CM | POA: Diagnosis not present

## 2017-02-17 DIAGNOSIS — M6281 Muscle weakness (generalized): Secondary | ICD-10-CM | POA: Diagnosis not present

## 2017-02-17 DIAGNOSIS — R29898 Other symptoms and signs involving the musculoskeletal system: Secondary | ICD-10-CM | POA: Diagnosis not present

## 2017-02-17 DIAGNOSIS — I4891 Unspecified atrial fibrillation: Secondary | ICD-10-CM | POA: Diagnosis not present

## 2017-02-18 DIAGNOSIS — Z9181 History of falling: Secondary | ICD-10-CM | POA: Diagnosis not present

## 2017-02-18 DIAGNOSIS — I4891 Unspecified atrial fibrillation: Secondary | ICD-10-CM | POA: Diagnosis not present

## 2017-02-18 DIAGNOSIS — R260 Ataxic gait: Secondary | ICD-10-CM | POA: Diagnosis not present

## 2017-02-18 DIAGNOSIS — R41841 Cognitive communication deficit: Secondary | ICD-10-CM | POA: Diagnosis not present

## 2017-02-18 DIAGNOSIS — M6281 Muscle weakness (generalized): Secondary | ICD-10-CM | POA: Diagnosis not present

## 2017-02-18 DIAGNOSIS — R29898 Other symptoms and signs involving the musculoskeletal system: Secondary | ICD-10-CM | POA: Diagnosis not present

## 2017-02-19 DIAGNOSIS — R2681 Unsteadiness on feet: Secondary | ICD-10-CM | POA: Diagnosis not present

## 2017-02-19 DIAGNOSIS — Z9181 History of falling: Secondary | ICD-10-CM | POA: Diagnosis not present

## 2017-02-19 DIAGNOSIS — I4891 Unspecified atrial fibrillation: Secondary | ICD-10-CM | POA: Diagnosis not present

## 2017-02-19 DIAGNOSIS — R260 Ataxic gait: Secondary | ICD-10-CM | POA: Diagnosis not present

## 2017-02-19 DIAGNOSIS — M6281 Muscle weakness (generalized): Secondary | ICD-10-CM | POA: Diagnosis not present

## 2017-02-19 DIAGNOSIS — I130 Hypertensive heart and chronic kidney disease with heart failure and stage 1 through stage 4 chronic kidney disease, or unspecified chronic kidney disease: Secondary | ICD-10-CM | POA: Diagnosis not present

## 2017-02-19 DIAGNOSIS — R29898 Other symptoms and signs involving the musculoskeletal system: Secondary | ICD-10-CM | POA: Diagnosis not present

## 2017-02-19 DIAGNOSIS — F0390 Unspecified dementia without behavioral disturbance: Secondary | ICD-10-CM | POA: Diagnosis not present

## 2017-02-19 DIAGNOSIS — J449 Chronic obstructive pulmonary disease, unspecified: Secondary | ICD-10-CM | POA: Diagnosis not present

## 2017-02-19 DIAGNOSIS — R41841 Cognitive communication deficit: Secondary | ICD-10-CM | POA: Diagnosis not present

## 2017-02-19 DIAGNOSIS — E039 Hypothyroidism, unspecified: Secondary | ICD-10-CM | POA: Diagnosis not present

## 2017-02-22 DIAGNOSIS — R260 Ataxic gait: Secondary | ICD-10-CM | POA: Diagnosis not present

## 2017-02-22 DIAGNOSIS — I4891 Unspecified atrial fibrillation: Secondary | ICD-10-CM | POA: Diagnosis not present

## 2017-02-22 DIAGNOSIS — Z9181 History of falling: Secondary | ICD-10-CM | POA: Diagnosis not present

## 2017-02-22 DIAGNOSIS — R29898 Other symptoms and signs involving the musculoskeletal system: Secondary | ICD-10-CM | POA: Diagnosis not present

## 2017-02-22 DIAGNOSIS — R41841 Cognitive communication deficit: Secondary | ICD-10-CM | POA: Diagnosis not present

## 2017-02-22 DIAGNOSIS — M6281 Muscle weakness (generalized): Secondary | ICD-10-CM | POA: Diagnosis not present

## 2017-02-23 DIAGNOSIS — I4891 Unspecified atrial fibrillation: Secondary | ICD-10-CM | POA: Diagnosis not present

## 2017-02-23 DIAGNOSIS — R41841 Cognitive communication deficit: Secondary | ICD-10-CM | POA: Diagnosis not present

## 2017-02-23 DIAGNOSIS — M6281 Muscle weakness (generalized): Secondary | ICD-10-CM | POA: Diagnosis not present

## 2017-02-23 DIAGNOSIS — R260 Ataxic gait: Secondary | ICD-10-CM | POA: Diagnosis not present

## 2017-02-23 DIAGNOSIS — R29898 Other symptoms and signs involving the musculoskeletal system: Secondary | ICD-10-CM | POA: Diagnosis not present

## 2017-02-23 DIAGNOSIS — Z9181 History of falling: Secondary | ICD-10-CM | POA: Diagnosis not present

## 2017-02-24 DIAGNOSIS — R41841 Cognitive communication deficit: Secondary | ICD-10-CM | POA: Diagnosis not present

## 2017-02-24 DIAGNOSIS — Z9181 History of falling: Secondary | ICD-10-CM | POA: Diagnosis not present

## 2017-02-24 DIAGNOSIS — M6281 Muscle weakness (generalized): Secondary | ICD-10-CM | POA: Diagnosis not present

## 2017-02-24 DIAGNOSIS — I4891 Unspecified atrial fibrillation: Secondary | ICD-10-CM | POA: Diagnosis not present

## 2017-02-24 DIAGNOSIS — R260 Ataxic gait: Secondary | ICD-10-CM | POA: Diagnosis not present

## 2017-02-24 DIAGNOSIS — R29898 Other symptoms and signs involving the musculoskeletal system: Secondary | ICD-10-CM | POA: Diagnosis not present

## 2017-02-25 DIAGNOSIS — I4891 Unspecified atrial fibrillation: Secondary | ICD-10-CM | POA: Diagnosis not present

## 2017-02-25 DIAGNOSIS — R29898 Other symptoms and signs involving the musculoskeletal system: Secondary | ICD-10-CM | POA: Diagnosis not present

## 2017-02-25 DIAGNOSIS — Z9181 History of falling: Secondary | ICD-10-CM | POA: Diagnosis not present

## 2017-02-25 DIAGNOSIS — R260 Ataxic gait: Secondary | ICD-10-CM | POA: Diagnosis not present

## 2017-02-25 DIAGNOSIS — M6281 Muscle weakness (generalized): Secondary | ICD-10-CM | POA: Diagnosis not present

## 2017-02-25 DIAGNOSIS — R41841 Cognitive communication deficit: Secondary | ICD-10-CM | POA: Diagnosis not present

## 2017-02-26 DIAGNOSIS — I4891 Unspecified atrial fibrillation: Secondary | ICD-10-CM | POA: Diagnosis not present

## 2017-02-26 DIAGNOSIS — R260 Ataxic gait: Secondary | ICD-10-CM | POA: Diagnosis not present

## 2017-02-26 DIAGNOSIS — Z9181 History of falling: Secondary | ICD-10-CM | POA: Diagnosis not present

## 2017-02-26 DIAGNOSIS — M6281 Muscle weakness (generalized): Secondary | ICD-10-CM | POA: Diagnosis not present

## 2017-02-26 DIAGNOSIS — R41841 Cognitive communication deficit: Secondary | ICD-10-CM | POA: Diagnosis not present

## 2017-02-26 DIAGNOSIS — R29898 Other symptoms and signs involving the musculoskeletal system: Secondary | ICD-10-CM | POA: Diagnosis not present

## 2017-03-01 DIAGNOSIS — I4891 Unspecified atrial fibrillation: Secondary | ICD-10-CM | POA: Diagnosis not present

## 2017-03-01 DIAGNOSIS — R41841 Cognitive communication deficit: Secondary | ICD-10-CM | POA: Diagnosis not present

## 2017-03-01 DIAGNOSIS — M6281 Muscle weakness (generalized): Secondary | ICD-10-CM | POA: Diagnosis not present

## 2017-03-01 DIAGNOSIS — R260 Ataxic gait: Secondary | ICD-10-CM | POA: Diagnosis not present

## 2017-03-01 DIAGNOSIS — Z9181 History of falling: Secondary | ICD-10-CM | POA: Diagnosis not present

## 2017-03-01 DIAGNOSIS — R29898 Other symptoms and signs involving the musculoskeletal system: Secondary | ICD-10-CM | POA: Diagnosis not present

## 2017-03-02 DIAGNOSIS — R41841 Cognitive communication deficit: Secondary | ICD-10-CM | POA: Diagnosis not present

## 2017-03-02 DIAGNOSIS — R29898 Other symptoms and signs involving the musculoskeletal system: Secondary | ICD-10-CM | POA: Diagnosis not present

## 2017-03-02 DIAGNOSIS — R260 Ataxic gait: Secondary | ICD-10-CM | POA: Diagnosis not present

## 2017-03-02 DIAGNOSIS — I4891 Unspecified atrial fibrillation: Secondary | ICD-10-CM | POA: Diagnosis not present

## 2017-03-02 DIAGNOSIS — Z9181 History of falling: Secondary | ICD-10-CM | POA: Diagnosis not present

## 2017-03-02 DIAGNOSIS — M6281 Muscle weakness (generalized): Secondary | ICD-10-CM | POA: Diagnosis not present

## 2017-03-03 DIAGNOSIS — M6281 Muscle weakness (generalized): Secondary | ICD-10-CM | POA: Diagnosis not present

## 2017-03-03 DIAGNOSIS — R260 Ataxic gait: Secondary | ICD-10-CM | POA: Diagnosis not present

## 2017-03-03 DIAGNOSIS — R29898 Other symptoms and signs involving the musculoskeletal system: Secondary | ICD-10-CM | POA: Diagnosis not present

## 2017-03-03 DIAGNOSIS — R41841 Cognitive communication deficit: Secondary | ICD-10-CM | POA: Diagnosis not present

## 2017-03-03 DIAGNOSIS — Z9181 History of falling: Secondary | ICD-10-CM | POA: Diagnosis not present

## 2017-03-03 DIAGNOSIS — I4891 Unspecified atrial fibrillation: Secondary | ICD-10-CM | POA: Diagnosis not present

## 2017-03-04 DIAGNOSIS — I4891 Unspecified atrial fibrillation: Secondary | ICD-10-CM | POA: Diagnosis not present

## 2017-03-04 DIAGNOSIS — M6281 Muscle weakness (generalized): Secondary | ICD-10-CM | POA: Diagnosis not present

## 2017-03-04 DIAGNOSIS — Z9181 History of falling: Secondary | ICD-10-CM | POA: Diagnosis not present

## 2017-03-04 DIAGNOSIS — R41841 Cognitive communication deficit: Secondary | ICD-10-CM | POA: Diagnosis not present

## 2017-03-04 DIAGNOSIS — R29898 Other symptoms and signs involving the musculoskeletal system: Secondary | ICD-10-CM | POA: Diagnosis not present

## 2017-03-04 DIAGNOSIS — R260 Ataxic gait: Secondary | ICD-10-CM | POA: Diagnosis not present

## 2017-03-10 DIAGNOSIS — F0390 Unspecified dementia without behavioral disturbance: Secondary | ICD-10-CM | POA: Diagnosis not present

## 2017-03-10 DIAGNOSIS — E039 Hypothyroidism, unspecified: Secondary | ICD-10-CM | POA: Diagnosis not present

## 2017-03-10 DIAGNOSIS — J449 Chronic obstructive pulmonary disease, unspecified: Secondary | ICD-10-CM | POA: Diagnosis not present

## 2017-03-10 DIAGNOSIS — I4891 Unspecified atrial fibrillation: Secondary | ICD-10-CM | POA: Diagnosis not present

## 2017-03-10 DIAGNOSIS — N183 Chronic kidney disease, stage 3 (moderate): Secondary | ICD-10-CM | POA: Diagnosis not present

## 2017-03-10 DIAGNOSIS — Z66 Do not resuscitate: Secondary | ICD-10-CM | POA: Diagnosis not present

## 2017-03-10 DIAGNOSIS — I509 Heart failure, unspecified: Secondary | ICD-10-CM | POA: Diagnosis not present

## 2017-03-10 DIAGNOSIS — I10 Essential (primary) hypertension: Secondary | ICD-10-CM | POA: Diagnosis not present

## 2017-03-31 DIAGNOSIS — R062 Wheezing: Secondary | ICD-10-CM | POA: Diagnosis not present

## 2017-04-01 DIAGNOSIS — J189 Pneumonia, unspecified organism: Secondary | ICD-10-CM | POA: Diagnosis not present

## 2017-04-01 DIAGNOSIS — I10 Essential (primary) hypertension: Secondary | ICD-10-CM | POA: Diagnosis not present

## 2017-04-01 DIAGNOSIS — I509 Heart failure, unspecified: Secondary | ICD-10-CM | POA: Diagnosis not present

## 2017-04-01 DIAGNOSIS — I4891 Unspecified atrial fibrillation: Secondary | ICD-10-CM | POA: Diagnosis not present

## 2017-04-23 DIAGNOSIS — D649 Anemia, unspecified: Secondary | ICD-10-CM | POA: Diagnosis not present

## 2017-04-23 DIAGNOSIS — Z79899 Other long term (current) drug therapy: Secondary | ICD-10-CM | POA: Diagnosis not present

## 2017-04-23 DIAGNOSIS — E785 Hyperlipidemia, unspecified: Secondary | ICD-10-CM | POA: Diagnosis not present

## 2017-04-23 DIAGNOSIS — I10 Essential (primary) hypertension: Secondary | ICD-10-CM | POA: Diagnosis not present

## 2017-05-12 DIAGNOSIS — Z9981 Dependence on supplemental oxygen: Secondary | ICD-10-CM | POA: Diagnosis not present

## 2017-05-12 DIAGNOSIS — J449 Chronic obstructive pulmonary disease, unspecified: Secondary | ICD-10-CM | POA: Diagnosis not present

## 2017-05-12 DIAGNOSIS — E039 Hypothyroidism, unspecified: Secondary | ICD-10-CM | POA: Diagnosis not present

## 2017-05-12 DIAGNOSIS — F0390 Unspecified dementia without behavioral disturbance: Secondary | ICD-10-CM | POA: Diagnosis not present

## 2017-05-12 DIAGNOSIS — I4891 Unspecified atrial fibrillation: Secondary | ICD-10-CM | POA: Diagnosis not present

## 2017-05-12 DIAGNOSIS — I10 Essential (primary) hypertension: Secondary | ICD-10-CM | POA: Diagnosis not present

## 2017-05-12 DIAGNOSIS — N183 Chronic kidney disease, stage 3 (moderate): Secondary | ICD-10-CM | POA: Diagnosis not present

## 2017-05-27 DIAGNOSIS — E785 Hyperlipidemia, unspecified: Secondary | ICD-10-CM | POA: Diagnosis not present

## 2017-05-27 DIAGNOSIS — Z79899 Other long term (current) drug therapy: Secondary | ICD-10-CM | POA: Diagnosis not present

## 2017-05-27 DIAGNOSIS — D649 Anemia, unspecified: Secondary | ICD-10-CM | POA: Diagnosis not present

## 2017-05-27 DIAGNOSIS — I10 Essential (primary) hypertension: Secondary | ICD-10-CM | POA: Diagnosis not present

## 2017-06-03 ENCOUNTER — Telehealth: Payer: Self-pay | Admitting: Internal Medicine

## 2017-06-03 NOTE — Telephone Encounter (Signed)
Spoke with daughter and advised her we received her message. We would be glad to have her accompany her mom and to let the front desk know when she arrives. She understood and nothing further is needed.

## 2017-06-07 ENCOUNTER — Other Ambulatory Visit (INDEPENDENT_AMBULATORY_CARE_PROVIDER_SITE_OTHER): Payer: Medicare Other

## 2017-06-07 ENCOUNTER — Ambulatory Visit (INDEPENDENT_AMBULATORY_CARE_PROVIDER_SITE_OTHER): Payer: Medicare Other | Admitting: Internal Medicine

## 2017-06-07 ENCOUNTER — Ambulatory Visit (INDEPENDENT_AMBULATORY_CARE_PROVIDER_SITE_OTHER)
Admission: RE | Admit: 2017-06-07 | Discharge: 2017-06-07 | Disposition: A | Discharge status: Home / Self Care | Payer: Medicare Other | Source: Ambulatory Visit | Attending: Internal Medicine | Admitting: Internal Medicine

## 2017-06-07 ENCOUNTER — Encounter: Payer: Self-pay | Admitting: Internal Medicine

## 2017-06-07 VITALS — BP 124/80 | HR 75 | Ht 62.0 in | Wt 160.0 lb

## 2017-06-07 DIAGNOSIS — I10 Essential (primary) hypertension: Secondary | ICD-10-CM

## 2017-06-07 DIAGNOSIS — J9611 Chronic respiratory failure with hypoxia: Secondary | ICD-10-CM | POA: Diagnosis not present

## 2017-06-07 DIAGNOSIS — I4891 Unspecified atrial fibrillation: Secondary | ICD-10-CM | POA: Diagnosis not present

## 2017-06-07 DIAGNOSIS — R0602 Shortness of breath: Secondary | ICD-10-CM

## 2017-06-07 LAB — BASIC METABOLIC PANEL
BUN: 36 mg/dL — ABNORMAL HIGH (ref 6–23)
CALCIUM: 9.6 mg/dL (ref 8.4–10.5)
CO2: 30 mEq/L (ref 19–32)
CREATININE: 1.33 mg/dL — AB (ref 0.40–1.20)
Chloride: 100 mEq/L (ref 96–112)
GFR: 39.81 mL/min — AB (ref >60.00)
GLUCOSE: 108 mg/dL — AB (ref 70–99)
Potassium: 4.5 mEq/L (ref 3.5–5.1)
SODIUM: 138 meq/L (ref 135–145)

## 2017-06-07 LAB — CBC WITH DIFFERENTIAL/PLATELET
Basophils Absolute: 0.1 10*3/uL (ref 0.0–0.1)
Basophils Relative: 1.1 % (ref 0.0–3.0)
Eosinophils Absolute: 0.3 10*3/uL (ref 0.0–0.7)
Eosinophils Relative: 3.6 % (ref 0.0–5.0)
HCT: 39.4 % (ref 36.0–46.0)
Hemoglobin: 13 g/dL (ref 12.0–15.0)
Lymphocytes Relative: 20.9 % (ref 12.0–46.0)
Lymphs Abs: 1.5 10*3/uL (ref 0.7–4.0)
MCHC: 33 g/dL (ref 30.0–36.0)
MCV: 92.9 fl (ref 78.0–100.0)
Monocytes Absolute: 1.1 10*3/uL — ABNORMAL HIGH (ref 0.1–1.0)
Monocytes Relative: 14.9 % — ABNORMAL HIGH (ref 3.0–12.0)
Neutro Abs: 4.2 10*3/uL (ref 1.4–7.7)
Neutrophils Relative %: 59.5 % (ref 43.0–77.0)
Platelets: 199 10*3/uL (ref 150.0–400.0)
RBC: 4.23 Mil/uL (ref 3.87–5.11)
RDW: 14.2 % (ref 11.5–15.5)
WBC: 7.1 10*3/uL (ref 4.0–10.5)

## 2017-06-07 LAB — BRAIN NATRIURETIC PEPTIDE: Pro B Natriuretic peptide (BNP): 87 pg/mL (ref 0.0–100.0)

## 2017-06-07 MED ORDER — LOSARTAN POTASSIUM 50 MG PO TABS: 50.0000 mg | ORAL_TABLET | Freq: Every day | ORAL | Status: AC

## 2017-06-07 MED ORDER — IPRATROPIUM-ALBUTEROL 0.5-2.5 (3) MG/3ML IN SOLN: 3.0000 mL | Freq: Four times a day (QID) | RESPIRATORY_TRACT | Status: AC | PRN

## 2017-06-07 NOTE — Progress Notes (Signed)
Subjective:     Patient ID: Christina [name], female   DOB: 1925/07/12, 83 y.o.   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  Discharge Diagnoses:     Principal Problem:  *Shortness of breath  CHF (congestive heart failure)  A-fib  HTN (hypertension)  Hypothyroidism  Acute on chronic kidney disease, stage 3    06/07/2017 1st [facility] office visit/ Christina [name]   Chief Complaint  Patient presents with  . Pulmonary Consult    Referred by Dr. Renford Dills for eval of recurrent PNA. She has been on supplemental o2 for the past year.   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 No unusual exp hx or h/o childhood pna/ asthma or knowledge of premature birth.  Sleeping ok flat without nocturnal  or early am exacerbation  of respiratory  c/o's or need for noct saba. Also denies any obvious fluctuation of symptoms with weather or environmental changes or other aggravating or alleviating factors except as outlined above   Current Allergies, Complete Past Medical History, Past Surgical History, Family History, and Social History were reviewed in [employer] record.  ROS  The following are not active complaints unless bolded sore throat, dysphagia, dental problems, itching,  sneezing,  nasal congestion or disharge of excess mucus or purulent secretions, ear ache,   fever, chills, sweats, unintended wt loss or wt gain, classically pleuritic or exertional cp,  orthopnea pnd or leg swelling, presyncope, palpitations, abdominal pain, anorexia, nausea, vomiting, diarrhea  or change in bowel habits or bladder habits, change in stools or change in urine, dysuria, hematuria,  rash, arthralgias, visual complaints, headache, numbness, weakness or ataxia or problems with walking or coordination,  change in mood/affect or memory.        Current Meds  Medication Sig  . acetaminophen (TYLENOL) 325 MG tablet Take 325 mg by mouth 3 (three) times daily.   . furosemide (LASIX) 40 MG tablet Take 40 mg by mouth daily.  Marland Kitchen guaiFENesin (MUCINEX) 600 MG 12 hr tablet Take 600 mg by mouth 2 (two) times daily.  Marland Kitchen ipratropium-albuterol (DUONEB) 0.5-2.5 (3) MG/3ML SOLN Take 3 mLs by nebulization every 6 (six) hours as needed.  Marland Kitchen levothyroxine (SYNTHROID, LEVOTHROID) 50 MCG tablet Take 50 mcg by mouth daily.   . Multiple Vitamins-Minerals (PRESERVISION AREDS) TABS Take 1 tablet by mouth daily.  Bertram Gala Glycol-Propyl Glycol (SYSTANE) 0.4-0.3 % GEL ophthalmic gel Place 1 application into both eyes daily.  . Vitamin D, Ergocalciferol, (DRISDOL) 50000 units CAPS capsule Take 50,000 Units by mouth every 7 (seven) days.  . [DISCONTINUED] ipratropium-albuterol (DUONEB) 0.5-2.5 (3) MG/3ML SOLN Take 3 mLs by nebulization 2 (two) times daily.  . [DISCONTINUED] lisinopril (PRINIVIL,ZESTRIL) 5 MG  tablet Take 5 mg by mouth daily.                Review of Systems     Objective:   Physical Exam    obese hoarse w/c bound wf nad     Wt Readings from Last 3 Encounters:  06/07/17 160 lb (72.6 kg)  10/19/11 169 lb 15.6 oz (77.1 kg)  05/27/11 180 lb (81.6 kg)    Vital signs reviewed  - Note on arrival 02 sats  98% on RA     HEENT: nl dentition, turbinates bilaterally, and oropharynx. Nl  external ear canals without cough reflex   NECK :  without JVD/Nodes/TM/ nl carotid upstrokes bilaterally   LUNGS: no acc muscle use,  Nl contour chest which is clear to A and P bilaterally without cough on insp or exp maneuvers   CV:  RRR  no s3 or murmur or increase in P2, and no edema   ABD:  soft and nontender with nl inspiratory excursion in the supine position. No bruits or organomegaly appreciated, bowel sounds nl  MS:    ext warm without deformities, calf tenderness, cyanosis or clubbing No obvious joint restrictions   SKIN: warm and dry without lesions    NEURO:  alert, approp, poor recent recall   with  no motor or cerebellar deficits apparent.    CXR PA and Lateral:   06/07/2017 :    I personally reviewed images and agree with radiology impression as follows:    No active cardiopulmonary disease.   Labs ordered/ reviewed:      Chemistry      Component Value Date/Time   NA 138 06/07/2017 1544   K 4.5 06/07/2017 1544   CL 100 06/07/2017 1544   CO2 30 06/07/2017 1544   BUN 36 (H) 06/07/2017 1544   CREATININE 1.33 (H) 06/07/2017 1544      Component Value Date/Time   CALCIUM 9.6 06/07/2017 1544   ALKPHOS 49 09/30/2010 0505   AST 22 09/30/2010 0505   ALT 14 09/30/2010 0505   BILITOT 0.4 09/30/2010 0505        Lab Results  Component Value Date   WBC 7.1 06/07/2017   HGB 13.0 06/07/2017   HCT 39.4 06/07/2017   MCV 92.9 06/07/2017   PLT 199.0 06/07/2017         Lab Results  Component Value Date   TSH 0.426 09/29/2010     Lab Results  Component Value Date   PROBNP 87.0 06/07/2017            Assessment:

## 2017-06-07 NOTE — Patient Instructions (Signed)
You do not have detectable copd but could have asthma so change duoneb to up to four times in 24 h as needed only  Stop lisinopril since it can make you look like you have copd / asthma when you don't  Start losartan 50 mg daily in its place  Change 02 to just take 2lpm at bedtime as if needed during the day for sats below 90%   Please remember to go to the lab and x-ray department downstairs in the basement  for your tests - we will call you with the results when they are available.  Pulmonary follow up is as needed

## 2017-06-08 DIAGNOSIS — J9611 Chronic respiratory failure with hypoxia: Secondary | ICD-10-CM | POA: Insufficient documentation

## 2017-06-08 NOTE — Progress Notes (Signed)
lmtcb

## 2017-06-08 NOTE — Assessment & Plan Note (Signed)
In the best review of chronic cough to date ( NEJM 2016 [phone] [id number]) ,  ACEi are now felt to cause cough in up to  20% of pts which is a 4 fold increase from previous reports and does not include the variety of non-specific complaints we see in pulmonary clinic in pts on ACEi but previously attributed to another dx like  Copd/asthma and  include PNDS, throat and chest congestion, "bronchitis", unexplained dyspnea and noct "strangling" sensations, and hoarseness, but also  atypical /refractory GERD symptoms like dysphagia and "bad heartburn"   The only way I know  to prove this is not an "ACEi Case" is a trial off ACEi x a minimum of 6 weeks then regroup.   Try losartan 50 mg daily and f/u prn

## 2017-06-08 NOTE — Assessment & Plan Note (Addendum)
Placed on 02  2lpm 2017  - 02 sats 98% RA 06/07/2017 > rec 2lpm hs    She likely has an element of obesity contributing to noct desats and note HC03 30 so may have borderline OHS but no need for daytime 02 as maint, can use prn sats < 90% then and continue to use it  automatically  2lpm hs unless check ono RA to verify safe off it.

## 2017-06-08 NOTE — Progress Notes (Signed)
LMTCB

## 2017-06-08 NOTE — Assessment & Plan Note (Addendum)
Spirometry 06/07/2017  FEV1 1.44 (104%)  Ratio 84 min curvature > 4 h since rx   - trial off acei rec 06/07/2017    When respiratory symptoms begin or become refractory well after a patient reports complete smoking cessation,  Especially when this wasn't the case while they were smoking, a red flag is raised based on the work of Dr Primitivo Gauze which states:  if you quit smoking when your best day FEV1 is still well preserved it is highly unlikely you will progress to severe disease.  That is to say, once the smoking stops,  the symptoms should not suddenly erupt or markedly worsen.  If so, the differential diagnosis should include  obesity/deconditioning,  LPR/Reflux/Aspiration syndromes,  occult CHF, or  especially side effect of medications commonly used in this population, esp acei   Since she has no h/o dysphagia or overt HB would start with trial off acei then return here prn  In meantime just use duoneb qid prn as no evidence of significant airflow obst here but may be prone to AB and certainly as ages may be risk to asp syndromes to monitor but no need for w/u for now.   Total time devoted to counseling  > 50 % of initial 60 min office visit:  review case with pt/daughter discussion of options/alternatives/ personally creating written customized instructions  in presence of pt  then going over those specific  Instructions directly with the pt including how to use all of the meds but in particular covering each new medication in detail and the difference between the maintenance= "automatic" meds and the prns using an action plan format for the latter (If this problem/symptom => do that organization reading Left to right).  Please see AVS from this visit for a full list of these instructions which I personally wrote for this pt and  are unique to this visit.

## 2017-06-09 NOTE — Progress Notes (Signed)
Spoke with pt's daughter and notified of results per Dr. Wert. Pt verbalized understanding and denied any questions. 

## 2017-06-09 NOTE — Progress Notes (Signed)
Daughter notified of results and denied any questions

## 2017-07-21 DIAGNOSIS — Z66 Do not resuscitate: Secondary | ICD-10-CM | POA: Diagnosis not present

## 2017-07-21 DIAGNOSIS — I4891 Unspecified atrial fibrillation: Secondary | ICD-10-CM | POA: Diagnosis not present

## 2017-07-21 DIAGNOSIS — E039 Hypothyroidism, unspecified: Secondary | ICD-10-CM | POA: Diagnosis not present

## 2017-07-21 DIAGNOSIS — J449 Chronic obstructive pulmonary disease, unspecified: Secondary | ICD-10-CM | POA: Diagnosis not present

## 2017-07-21 DIAGNOSIS — I509 Heart failure, unspecified: Secondary | ICD-10-CM | POA: Diagnosis not present

## 2017-07-28 DIAGNOSIS — I10 Essential (primary) hypertension: Secondary | ICD-10-CM | POA: Diagnosis not present

## 2017-07-28 DIAGNOSIS — I4891 Unspecified atrial fibrillation: Secondary | ICD-10-CM | POA: Diagnosis not present

## 2017-07-28 DIAGNOSIS — F0390 Unspecified dementia without behavioral disturbance: Secondary | ICD-10-CM | POA: Diagnosis not present

## 2017-07-28 DIAGNOSIS — R05 Cough: Secondary | ICD-10-CM | POA: Diagnosis not present

## 2017-08-16 DIAGNOSIS — I4891 Unspecified atrial fibrillation: Secondary | ICD-10-CM | POA: Diagnosis not present

## 2017-08-16 DIAGNOSIS — I130 Hypertensive heart and chronic kidney disease with heart failure and stage 1 through stage 4 chronic kidney disease, or unspecified chronic kidney disease: Secondary | ICD-10-CM | POA: Diagnosis not present

## 2017-08-16 DIAGNOSIS — M6281 Muscle weakness (generalized): Secondary | ICD-10-CM | POA: Diagnosis not present

## 2017-08-16 DIAGNOSIS — R29898 Other symptoms and signs involving the musculoskeletal system: Secondary | ICD-10-CM | POA: Diagnosis not present

## 2017-08-16 DIAGNOSIS — Z9181 History of falling: Secondary | ICD-10-CM | POA: Diagnosis not present

## 2017-08-16 DIAGNOSIS — R1311 Dysphagia, oral phase: Secondary | ICD-10-CM | POA: Diagnosis not present

## 2017-08-16 DIAGNOSIS — R2681 Unsteadiness on feet: Secondary | ICD-10-CM | POA: Diagnosis not present

## 2017-08-16 DIAGNOSIS — R260 Ataxic gait: Secondary | ICD-10-CM | POA: Diagnosis not present

## 2017-08-16 DIAGNOSIS — E039 Hypothyroidism, unspecified: Secondary | ICD-10-CM | POA: Diagnosis not present

## 2017-08-16 DIAGNOSIS — J449 Chronic obstructive pulmonary disease, unspecified: Secondary | ICD-10-CM | POA: Diagnosis not present

## 2017-08-16 DIAGNOSIS — F0390 Unspecified dementia without behavioral disturbance: Secondary | ICD-10-CM | POA: Diagnosis not present

## 2017-08-16 DIAGNOSIS — R41841 Cognitive communication deficit: Secondary | ICD-10-CM | POA: Diagnosis not present

## 2017-08-17 DIAGNOSIS — R1311 Dysphagia, oral phase: Secondary | ICD-10-CM | POA: Diagnosis not present

## 2017-08-17 DIAGNOSIS — Z9181 History of falling: Secondary | ICD-10-CM | POA: Diagnosis not present

## 2017-08-17 DIAGNOSIS — R260 Ataxic gait: Secondary | ICD-10-CM | POA: Diagnosis not present

## 2017-08-17 DIAGNOSIS — R29898 Other symptoms and signs involving the musculoskeletal system: Secondary | ICD-10-CM | POA: Diagnosis not present

## 2017-08-17 DIAGNOSIS — R41841 Cognitive communication deficit: Secondary | ICD-10-CM | POA: Diagnosis not present

## 2017-08-17 DIAGNOSIS — M6281 Muscle weakness (generalized): Secondary | ICD-10-CM | POA: Diagnosis not present

## 2017-08-18 DIAGNOSIS — R260 Ataxic gait: Secondary | ICD-10-CM | POA: Diagnosis not present

## 2017-08-18 DIAGNOSIS — R29898 Other symptoms and signs involving the musculoskeletal system: Secondary | ICD-10-CM | POA: Diagnosis not present

## 2017-08-18 DIAGNOSIS — R1311 Dysphagia, oral phase: Secondary | ICD-10-CM | POA: Diagnosis not present

## 2017-08-18 DIAGNOSIS — R41841 Cognitive communication deficit: Secondary | ICD-10-CM | POA: Diagnosis not present

## 2017-08-18 DIAGNOSIS — M6281 Muscle weakness (generalized): Secondary | ICD-10-CM | POA: Diagnosis not present

## 2017-08-18 DIAGNOSIS — Z9181 History of falling: Secondary | ICD-10-CM | POA: Diagnosis not present

## 2017-08-19 DIAGNOSIS — R1311 Dysphagia, oral phase: Secondary | ICD-10-CM | POA: Diagnosis not present

## 2017-08-19 DIAGNOSIS — R260 Ataxic gait: Secondary | ICD-10-CM | POA: Diagnosis not present

## 2017-08-19 DIAGNOSIS — R29898 Other symptoms and signs involving the musculoskeletal system: Secondary | ICD-10-CM | POA: Diagnosis not present

## 2017-08-19 DIAGNOSIS — M6281 Muscle weakness (generalized): Secondary | ICD-10-CM | POA: Diagnosis not present

## 2017-08-19 DIAGNOSIS — R41841 Cognitive communication deficit: Secondary | ICD-10-CM | POA: Diagnosis not present

## 2017-08-19 DIAGNOSIS — Z9181 History of falling: Secondary | ICD-10-CM | POA: Diagnosis not present

## 2017-08-20 DIAGNOSIS — R29898 Other symptoms and signs involving the musculoskeletal system: Secondary | ICD-10-CM | POA: Diagnosis not present

## 2017-08-20 DIAGNOSIS — R260 Ataxic gait: Secondary | ICD-10-CM | POA: Diagnosis not present

## 2017-08-20 DIAGNOSIS — R1311 Dysphagia, oral phase: Secondary | ICD-10-CM | POA: Diagnosis not present

## 2017-08-20 DIAGNOSIS — Z9181 History of falling: Secondary | ICD-10-CM | POA: Diagnosis not present

## 2017-08-20 DIAGNOSIS — M6281 Muscle weakness (generalized): Secondary | ICD-10-CM | POA: Diagnosis not present

## 2017-08-20 DIAGNOSIS — R41841 Cognitive communication deficit: Secondary | ICD-10-CM | POA: Diagnosis not present

## 2017-08-23 DIAGNOSIS — E039 Hypothyroidism, unspecified: Secondary | ICD-10-CM | POA: Diagnosis not present

## 2017-08-23 DIAGNOSIS — R1312 Dysphagia, oropharyngeal phase: Secondary | ICD-10-CM | POA: Diagnosis not present

## 2017-08-23 DIAGNOSIS — Z9181 History of falling: Secondary | ICD-10-CM | POA: Diagnosis not present

## 2017-08-23 DIAGNOSIS — F0390 Unspecified dementia without behavioral disturbance: Secondary | ICD-10-CM | POA: Diagnosis not present

## 2017-08-23 DIAGNOSIS — J449 Chronic obstructive pulmonary disease, unspecified: Secondary | ICD-10-CM | POA: Diagnosis not present

## 2017-08-23 DIAGNOSIS — R41841 Cognitive communication deficit: Secondary | ICD-10-CM | POA: Diagnosis not present

## 2017-08-23 DIAGNOSIS — R2681 Unsteadiness on feet: Secondary | ICD-10-CM | POA: Diagnosis not present

## 2017-08-23 DIAGNOSIS — I4891 Unspecified atrial fibrillation: Secondary | ICD-10-CM | POA: Diagnosis not present

## 2017-08-23 DIAGNOSIS — R1311 Dysphagia, oral phase: Secondary | ICD-10-CM | POA: Diagnosis not present

## 2017-08-23 DIAGNOSIS — R29898 Other symptoms and signs involving the musculoskeletal system: Secondary | ICD-10-CM | POA: Diagnosis not present

## 2017-08-23 DIAGNOSIS — M6281 Muscle weakness (generalized): Secondary | ICD-10-CM | POA: Diagnosis not present

## 2017-08-23 DIAGNOSIS — I130 Hypertensive heart and chronic kidney disease with heart failure and stage 1 through stage 4 chronic kidney disease, or unspecified chronic kidney disease: Secondary | ICD-10-CM | POA: Diagnosis not present

## 2017-08-24 DIAGNOSIS — R29898 Other symptoms and signs involving the musculoskeletal system: Secondary | ICD-10-CM | POA: Diagnosis not present

## 2017-08-24 DIAGNOSIS — M6281 Muscle weakness (generalized): Secondary | ICD-10-CM | POA: Diagnosis not present

## 2017-08-24 DIAGNOSIS — Z9181 History of falling: Secondary | ICD-10-CM | POA: Diagnosis not present

## 2017-08-24 DIAGNOSIS — R1312 Dysphagia, oropharyngeal phase: Secondary | ICD-10-CM | POA: Diagnosis not present

## 2017-08-24 DIAGNOSIS — R1311 Dysphagia, oral phase: Secondary | ICD-10-CM | POA: Diagnosis not present

## 2017-08-24 DIAGNOSIS — R41841 Cognitive communication deficit: Secondary | ICD-10-CM | POA: Diagnosis not present

## 2017-09-08 DIAGNOSIS — I4891 Unspecified atrial fibrillation: Secondary | ICD-10-CM | POA: Diagnosis not present

## 2017-09-08 DIAGNOSIS — J449 Chronic obstructive pulmonary disease, unspecified: Secondary | ICD-10-CM | POA: Diagnosis not present

## 2017-09-08 DIAGNOSIS — E039 Hypothyroidism, unspecified: Secondary | ICD-10-CM | POA: Diagnosis not present

## 2017-09-08 DIAGNOSIS — N183 Chronic kidney disease, stage 3 (moderate): Secondary | ICD-10-CM | POA: Diagnosis not present

## 2017-09-08 DIAGNOSIS — Z66 Do not resuscitate: Secondary | ICD-10-CM | POA: Diagnosis not present

## 2017-09-08 DIAGNOSIS — Z9981 Dependence on supplemental oxygen: Secondary | ICD-10-CM | POA: Diagnosis not present

## 2017-09-08 DIAGNOSIS — F0390 Unspecified dementia without behavioral disturbance: Secondary | ICD-10-CM | POA: Diagnosis not present

## 2017-09-11 DIAGNOSIS — R062 Wheezing: Secondary | ICD-10-CM | POA: Diagnosis not present

## 2017-09-15 DIAGNOSIS — R29898 Other symptoms and signs involving the musculoskeletal system: Secondary | ICD-10-CM | POA: Diagnosis not present

## 2017-09-15 DIAGNOSIS — R41841 Cognitive communication deficit: Secondary | ICD-10-CM | POA: Diagnosis not present

## 2017-09-15 DIAGNOSIS — Z9181 History of falling: Secondary | ICD-10-CM | POA: Diagnosis not present

## 2017-09-15 DIAGNOSIS — R1311 Dysphagia, oral phase: Secondary | ICD-10-CM | POA: Diagnosis not present

## 2017-09-15 DIAGNOSIS — R1312 Dysphagia, oropharyngeal phase: Secondary | ICD-10-CM | POA: Diagnosis not present

## 2017-09-15 DIAGNOSIS — M6281 Muscle weakness (generalized): Secondary | ICD-10-CM | POA: Diagnosis not present

## 2017-09-16 DIAGNOSIS — R41841 Cognitive communication deficit: Secondary | ICD-10-CM | POA: Diagnosis not present

## 2017-09-16 DIAGNOSIS — R1312 Dysphagia, oropharyngeal phase: Secondary | ICD-10-CM | POA: Diagnosis not present

## 2017-09-16 DIAGNOSIS — R29898 Other symptoms and signs involving the musculoskeletal system: Secondary | ICD-10-CM | POA: Diagnosis not present

## 2017-09-16 DIAGNOSIS — M6281 Muscle weakness (generalized): Secondary | ICD-10-CM | POA: Diagnosis not present

## 2017-09-16 DIAGNOSIS — R1311 Dysphagia, oral phase: Secondary | ICD-10-CM | POA: Diagnosis not present

## 2017-09-16 DIAGNOSIS — Z9181 History of falling: Secondary | ICD-10-CM | POA: Diagnosis not present

## 2017-09-17 DIAGNOSIS — R1312 Dysphagia, oropharyngeal phase: Secondary | ICD-10-CM | POA: Diagnosis not present

## 2017-09-17 DIAGNOSIS — R1311 Dysphagia, oral phase: Secondary | ICD-10-CM | POA: Diagnosis not present

## 2017-09-17 DIAGNOSIS — Z9181 History of falling: Secondary | ICD-10-CM | POA: Diagnosis not present

## 2017-09-17 DIAGNOSIS — M6281 Muscle weakness (generalized): Secondary | ICD-10-CM | POA: Diagnosis not present

## 2017-09-17 DIAGNOSIS — R29898 Other symptoms and signs involving the musculoskeletal system: Secondary | ICD-10-CM | POA: Diagnosis not present

## 2017-09-17 DIAGNOSIS — R41841 Cognitive communication deficit: Secondary | ICD-10-CM | POA: Diagnosis not present

## 2017-09-19 DIAGNOSIS — R29898 Other symptoms and signs involving the musculoskeletal system: Secondary | ICD-10-CM | POA: Diagnosis not present

## 2017-09-19 DIAGNOSIS — Z9181 History of falling: Secondary | ICD-10-CM | POA: Diagnosis not present

## 2017-09-19 DIAGNOSIS — M6281 Muscle weakness (generalized): Secondary | ICD-10-CM | POA: Diagnosis not present

## 2017-09-19 DIAGNOSIS — R1311 Dysphagia, oral phase: Secondary | ICD-10-CM | POA: Diagnosis not present

## 2017-09-19 DIAGNOSIS — R1312 Dysphagia, oropharyngeal phase: Secondary | ICD-10-CM | POA: Diagnosis not present

## 2017-09-19 DIAGNOSIS — R41841 Cognitive communication deficit: Secondary | ICD-10-CM | POA: Diagnosis not present

## 2017-09-22 DIAGNOSIS — R29898 Other symptoms and signs involving the musculoskeletal system: Secondary | ICD-10-CM | POA: Diagnosis not present

## 2017-09-22 DIAGNOSIS — M6281 Muscle weakness (generalized): Secondary | ICD-10-CM | POA: Diagnosis not present

## 2017-09-22 DIAGNOSIS — R1312 Dysphagia, oropharyngeal phase: Secondary | ICD-10-CM | POA: Diagnosis not present

## 2017-09-22 DIAGNOSIS — R41841 Cognitive communication deficit: Secondary | ICD-10-CM | POA: Diagnosis not present

## 2017-09-22 DIAGNOSIS — I130 Hypertensive heart and chronic kidney disease with heart failure and stage 1 through stage 4 chronic kidney disease, or unspecified chronic kidney disease: Secondary | ICD-10-CM | POA: Diagnosis not present

## 2017-09-22 DIAGNOSIS — R1311 Dysphagia, oral phase: Secondary | ICD-10-CM | POA: Diagnosis not present

## 2017-09-22 DIAGNOSIS — I4891 Unspecified atrial fibrillation: Secondary | ICD-10-CM | POA: Diagnosis not present

## 2017-09-22 DIAGNOSIS — Z9181 History of falling: Secondary | ICD-10-CM | POA: Diagnosis not present

## 2017-09-22 DIAGNOSIS — R2681 Unsteadiness on feet: Secondary | ICD-10-CM | POA: Diagnosis not present

## 2017-09-22 DIAGNOSIS — E039 Hypothyroidism, unspecified: Secondary | ICD-10-CM | POA: Diagnosis not present

## 2017-09-22 DIAGNOSIS — J449 Chronic obstructive pulmonary disease, unspecified: Secondary | ICD-10-CM | POA: Diagnosis not present

## 2017-09-22 DIAGNOSIS — F0390 Unspecified dementia without behavioral disturbance: Secondary | ICD-10-CM | POA: Diagnosis not present

## 2017-09-23 DIAGNOSIS — Z9181 History of falling: Secondary | ICD-10-CM | POA: Diagnosis not present

## 2017-09-23 DIAGNOSIS — R1312 Dysphagia, oropharyngeal phase: Secondary | ICD-10-CM | POA: Diagnosis not present

## 2017-09-23 DIAGNOSIS — M6281 Muscle weakness (generalized): Secondary | ICD-10-CM | POA: Diagnosis not present

## 2017-09-23 DIAGNOSIS — R1311 Dysphagia, oral phase: Secondary | ICD-10-CM | POA: Diagnosis not present

## 2017-09-23 DIAGNOSIS — R29898 Other symptoms and signs involving the musculoskeletal system: Secondary | ICD-10-CM | POA: Diagnosis not present

## 2017-09-23 DIAGNOSIS — R41841 Cognitive communication deficit: Secondary | ICD-10-CM | POA: Diagnosis not present

## 2017-09-24 DIAGNOSIS — R41841 Cognitive communication deficit: Secondary | ICD-10-CM | POA: Diagnosis not present

## 2017-09-24 DIAGNOSIS — R1312 Dysphagia, oropharyngeal phase: Secondary | ICD-10-CM | POA: Diagnosis not present

## 2017-09-24 DIAGNOSIS — Z9181 History of falling: Secondary | ICD-10-CM | POA: Diagnosis not present

## 2017-09-24 DIAGNOSIS — R29898 Other symptoms and signs involving the musculoskeletal system: Secondary | ICD-10-CM | POA: Diagnosis not present

## 2017-09-24 DIAGNOSIS — M6281 Muscle weakness (generalized): Secondary | ICD-10-CM | POA: Diagnosis not present

## 2017-09-24 DIAGNOSIS — R1311 Dysphagia, oral phase: Secondary | ICD-10-CM | POA: Diagnosis not present

## 2017-09-27 DIAGNOSIS — M6281 Muscle weakness (generalized): Secondary | ICD-10-CM | POA: Diagnosis not present

## 2017-09-27 DIAGNOSIS — Z9181 History of falling: Secondary | ICD-10-CM | POA: Diagnosis not present

## 2017-09-27 DIAGNOSIS — R29898 Other symptoms and signs involving the musculoskeletal system: Secondary | ICD-10-CM | POA: Diagnosis not present

## 2017-09-27 DIAGNOSIS — R41841 Cognitive communication deficit: Secondary | ICD-10-CM | POA: Diagnosis not present

## 2017-09-27 DIAGNOSIS — R1311 Dysphagia, oral phase: Secondary | ICD-10-CM | POA: Diagnosis not present

## 2017-09-27 DIAGNOSIS — R1312 Dysphagia, oropharyngeal phase: Secondary | ICD-10-CM | POA: Diagnosis not present

## 2017-09-28 DIAGNOSIS — Z9181 History of falling: Secondary | ICD-10-CM | POA: Diagnosis not present

## 2017-09-28 DIAGNOSIS — R1311 Dysphagia, oral phase: Secondary | ICD-10-CM | POA: Diagnosis not present

## 2017-09-28 DIAGNOSIS — R29898 Other symptoms and signs involving the musculoskeletal system: Secondary | ICD-10-CM | POA: Diagnosis not present

## 2017-09-28 DIAGNOSIS — R1312 Dysphagia, oropharyngeal phase: Secondary | ICD-10-CM | POA: Diagnosis not present

## 2017-09-28 DIAGNOSIS — R41841 Cognitive communication deficit: Secondary | ICD-10-CM | POA: Diagnosis not present

## 2017-09-28 DIAGNOSIS — M6281 Muscle weakness (generalized): Secondary | ICD-10-CM | POA: Diagnosis not present

## 2017-09-29 DIAGNOSIS — Z9181 History of falling: Secondary | ICD-10-CM | POA: Diagnosis not present

## 2017-09-29 DIAGNOSIS — R29898 Other symptoms and signs involving the musculoskeletal system: Secondary | ICD-10-CM | POA: Diagnosis not present

## 2017-09-29 DIAGNOSIS — R41841 Cognitive communication deficit: Secondary | ICD-10-CM | POA: Diagnosis not present

## 2017-09-29 DIAGNOSIS — R1311 Dysphagia, oral phase: Secondary | ICD-10-CM | POA: Diagnosis not present

## 2017-09-29 DIAGNOSIS — M6281 Muscle weakness (generalized): Secondary | ICD-10-CM | POA: Diagnosis not present

## 2017-09-29 DIAGNOSIS — R1312 Dysphagia, oropharyngeal phase: Secondary | ICD-10-CM | POA: Diagnosis not present

## 2017-10-01 DIAGNOSIS — B351 Tinea unguium: Secondary | ICD-10-CM | POA: Diagnosis not present

## 2017-10-01 DIAGNOSIS — I739 Peripheral vascular disease, unspecified: Secondary | ICD-10-CM | POA: Diagnosis not present

## 2017-10-01 DIAGNOSIS — R262 Difficulty in walking, not elsewhere classified: Secondary | ICD-10-CM | POA: Diagnosis not present

## 2017-10-13 DIAGNOSIS — F0390 Unspecified dementia without behavioral disturbance: Secondary | ICD-10-CM | POA: Diagnosis not present

## 2017-10-20 DIAGNOSIS — F0390 Unspecified dementia without behavioral disturbance: Secondary | ICD-10-CM | POA: Diagnosis not present

## 2017-11-02 DIAGNOSIS — R062 Wheezing: Secondary | ICD-10-CM | POA: Diagnosis not present

## 2017-11-03 DIAGNOSIS — J449 Chronic obstructive pulmonary disease, unspecified: Secondary | ICD-10-CM | POA: Diagnosis not present

## 2017-11-03 DIAGNOSIS — F0390 Unspecified dementia without behavioral disturbance: Secondary | ICD-10-CM | POA: Diagnosis not present

## 2017-11-03 DIAGNOSIS — N183 Chronic kidney disease, stage 3 (moderate): Secondary | ICD-10-CM | POA: Diagnosis not present

## 2017-11-03 DIAGNOSIS — J189 Pneumonia, unspecified organism: Secondary | ICD-10-CM | POA: Diagnosis not present

## 2017-11-03 DIAGNOSIS — I4891 Unspecified atrial fibrillation: Secondary | ICD-10-CM | POA: Diagnosis not present

## 2017-11-03 DIAGNOSIS — I509 Heart failure, unspecified: Secondary | ICD-10-CM | POA: Diagnosis not present

## 2017-11-03 DIAGNOSIS — I10 Essential (primary) hypertension: Secondary | ICD-10-CM | POA: Diagnosis not present

## 2017-11-15 DIAGNOSIS — R0902 Hypoxemia: Secondary | ICD-10-CM | POA: Diagnosis not present

## 2017-11-15 DIAGNOSIS — I509 Heart failure, unspecified: Secondary | ICD-10-CM | POA: Diagnosis not present

## 2017-11-15 DIAGNOSIS — I4891 Unspecified atrial fibrillation: Secondary | ICD-10-CM | POA: Diagnosis not present

## 2017-11-15 DIAGNOSIS — J189 Pneumonia, unspecified organism: Secondary | ICD-10-CM | POA: Diagnosis not present

## 2017-12-01 DIAGNOSIS — F0390 Unspecified dementia without behavioral disturbance: Secondary | ICD-10-CM | POA: Diagnosis not present

## 2017-12-15 DIAGNOSIS — F331 Major depressive disorder, recurrent, moderate: Secondary | ICD-10-CM | POA: Diagnosis not present

## 2017-12-15 DIAGNOSIS — F0390 Unspecified dementia without behavioral disturbance: Secondary | ICD-10-CM | POA: Diagnosis not present

## 2017-12-16 DIAGNOSIS — F331 Major depressive disorder, recurrent, moderate: Secondary | ICD-10-CM | POA: Diagnosis not present

## 2017-12-16 DIAGNOSIS — F0390 Unspecified dementia without behavioral disturbance: Secondary | ICD-10-CM | POA: Diagnosis not present

## 2017-12-23 DIAGNOSIS — R41841 Cognitive communication deficit: Secondary | ICD-10-CM | POA: Diagnosis not present

## 2017-12-23 DIAGNOSIS — M6281 Muscle weakness (generalized): Secondary | ICD-10-CM | POA: Diagnosis not present

## 2017-12-23 DIAGNOSIS — R278 Other lack of coordination: Secondary | ICD-10-CM | POA: Diagnosis not present

## 2017-12-23 DIAGNOSIS — Z9181 History of falling: Secondary | ICD-10-CM | POA: Diagnosis not present

## 2017-12-23 DIAGNOSIS — J449 Chronic obstructive pulmonary disease, unspecified: Secondary | ICD-10-CM | POA: Diagnosis not present

## 2017-12-23 DIAGNOSIS — R1311 Dysphagia, oral phase: Secondary | ICD-10-CM | POA: Diagnosis not present

## 2017-12-23 DIAGNOSIS — F0390 Unspecified dementia without behavioral disturbance: Secondary | ICD-10-CM | POA: Diagnosis not present

## 2017-12-23 DIAGNOSIS — R2681 Unsteadiness on feet: Secondary | ICD-10-CM | POA: Diagnosis not present

## 2017-12-23 DIAGNOSIS — R29898 Other symptoms and signs involving the musculoskeletal system: Secondary | ICD-10-CM | POA: Diagnosis not present

## 2017-12-23 DIAGNOSIS — I130 Hypertensive heart and chronic kidney disease with heart failure and stage 1 through stage 4 chronic kidney disease, or unspecified chronic kidney disease: Secondary | ICD-10-CM | POA: Diagnosis not present

## 2017-12-23 DIAGNOSIS — E039 Hypothyroidism, unspecified: Secondary | ICD-10-CM | POA: Diagnosis not present

## 2017-12-23 DIAGNOSIS — I4891 Unspecified atrial fibrillation: Secondary | ICD-10-CM | POA: Diagnosis not present

## 2017-12-23 DIAGNOSIS — R1312 Dysphagia, oropharyngeal phase: Secondary | ICD-10-CM | POA: Diagnosis not present

## 2017-12-24 DIAGNOSIS — R29898 Other symptoms and signs involving the musculoskeletal system: Secondary | ICD-10-CM | POA: Diagnosis not present

## 2017-12-24 DIAGNOSIS — R1312 Dysphagia, oropharyngeal phase: Secondary | ICD-10-CM | POA: Diagnosis not present

## 2017-12-24 DIAGNOSIS — R1311 Dysphagia, oral phase: Secondary | ICD-10-CM | POA: Diagnosis not present

## 2017-12-24 DIAGNOSIS — R41841 Cognitive communication deficit: Secondary | ICD-10-CM | POA: Diagnosis not present

## 2017-12-24 DIAGNOSIS — M6281 Muscle weakness (generalized): Secondary | ICD-10-CM | POA: Diagnosis not present

## 2017-12-24 DIAGNOSIS — Z9181 History of falling: Secondary | ICD-10-CM | POA: Diagnosis not present

## 2017-12-27 DIAGNOSIS — R1312 Dysphagia, oropharyngeal phase: Secondary | ICD-10-CM | POA: Diagnosis not present

## 2017-12-27 DIAGNOSIS — Z9181 History of falling: Secondary | ICD-10-CM | POA: Diagnosis not present

## 2017-12-27 DIAGNOSIS — R1311 Dysphagia, oral phase: Secondary | ICD-10-CM | POA: Diagnosis not present

## 2017-12-27 DIAGNOSIS — R41841 Cognitive communication deficit: Secondary | ICD-10-CM | POA: Diagnosis not present

## 2017-12-27 DIAGNOSIS — R29898 Other symptoms and signs involving the musculoskeletal system: Secondary | ICD-10-CM | POA: Diagnosis not present

## 2017-12-27 DIAGNOSIS — M6281 Muscle weakness (generalized): Secondary | ICD-10-CM | POA: Diagnosis not present

## 2017-12-28 DIAGNOSIS — M6281 Muscle weakness (generalized): Secondary | ICD-10-CM | POA: Diagnosis not present

## 2017-12-28 DIAGNOSIS — R41841 Cognitive communication deficit: Secondary | ICD-10-CM | POA: Diagnosis not present

## 2017-12-28 DIAGNOSIS — Z9181 History of falling: Secondary | ICD-10-CM | POA: Diagnosis not present

## 2017-12-28 DIAGNOSIS — R1311 Dysphagia, oral phase: Secondary | ICD-10-CM | POA: Diagnosis not present

## 2017-12-28 DIAGNOSIS — R29898 Other symptoms and signs involving the musculoskeletal system: Secondary | ICD-10-CM | POA: Diagnosis not present

## 2017-12-28 DIAGNOSIS — R1312 Dysphagia, oropharyngeal phase: Secondary | ICD-10-CM | POA: Diagnosis not present

## 2017-12-29 DIAGNOSIS — M6281 Muscle weakness (generalized): Secondary | ICD-10-CM | POA: Diagnosis not present

## 2017-12-29 DIAGNOSIS — E039 Hypothyroidism, unspecified: Secondary | ICD-10-CM | POA: Diagnosis not present

## 2017-12-29 DIAGNOSIS — Z9181 History of falling: Secondary | ICD-10-CM | POA: Diagnosis not present

## 2017-12-29 DIAGNOSIS — I10 Essential (primary) hypertension: Secondary | ICD-10-CM | POA: Diagnosis not present

## 2017-12-29 DIAGNOSIS — R1312 Dysphagia, oropharyngeal phase: Secondary | ICD-10-CM | POA: Diagnosis not present

## 2017-12-29 DIAGNOSIS — R29898 Other symptoms and signs involving the musculoskeletal system: Secondary | ICD-10-CM | POA: Diagnosis not present

## 2017-12-29 DIAGNOSIS — N183 Chronic kidney disease, stage 3 (moderate): Secondary | ICD-10-CM | POA: Diagnosis not present

## 2017-12-29 DIAGNOSIS — R41841 Cognitive communication deficit: Secondary | ICD-10-CM | POA: Diagnosis not present

## 2017-12-29 DIAGNOSIS — J449 Chronic obstructive pulmonary disease, unspecified: Secondary | ICD-10-CM | POA: Diagnosis not present

## 2017-12-29 DIAGNOSIS — F0390 Unspecified dementia without behavioral disturbance: Secondary | ICD-10-CM | POA: Diagnosis not present

## 2017-12-29 DIAGNOSIS — R1311 Dysphagia, oral phase: Secondary | ICD-10-CM | POA: Diagnosis not present

## 2017-12-30 DIAGNOSIS — R1311 Dysphagia, oral phase: Secondary | ICD-10-CM | POA: Diagnosis not present

## 2017-12-30 DIAGNOSIS — M6281 Muscle weakness (generalized): Secondary | ICD-10-CM | POA: Diagnosis not present

## 2017-12-30 DIAGNOSIS — R1312 Dysphagia, oropharyngeal phase: Secondary | ICD-10-CM | POA: Diagnosis not present

## 2017-12-30 DIAGNOSIS — R29898 Other symptoms and signs involving the musculoskeletal system: Secondary | ICD-10-CM | POA: Diagnosis not present

## 2017-12-30 DIAGNOSIS — R41841 Cognitive communication deficit: Secondary | ICD-10-CM | POA: Diagnosis not present

## 2017-12-30 DIAGNOSIS — Z9181 History of falling: Secondary | ICD-10-CM | POA: Diagnosis not present

## 2017-12-31 DIAGNOSIS — R1311 Dysphagia, oral phase: Secondary | ICD-10-CM | POA: Diagnosis not present

## 2017-12-31 DIAGNOSIS — R29898 Other symptoms and signs involving the musculoskeletal system: Secondary | ICD-10-CM | POA: Diagnosis not present

## 2017-12-31 DIAGNOSIS — R41841 Cognitive communication deficit: Secondary | ICD-10-CM | POA: Diagnosis not present

## 2017-12-31 DIAGNOSIS — R1312 Dysphagia, oropharyngeal phase: Secondary | ICD-10-CM | POA: Diagnosis not present

## 2017-12-31 DIAGNOSIS — Z9181 History of falling: Secondary | ICD-10-CM | POA: Diagnosis not present

## 2017-12-31 DIAGNOSIS — M6281 Muscle weakness (generalized): Secondary | ICD-10-CM | POA: Diagnosis not present

## 2018-01-03 DIAGNOSIS — R41841 Cognitive communication deficit: Secondary | ICD-10-CM | POA: Diagnosis not present

## 2018-01-03 DIAGNOSIS — R1311 Dysphagia, oral phase: Secondary | ICD-10-CM | POA: Diagnosis not present

## 2018-01-03 DIAGNOSIS — R29898 Other symptoms and signs involving the musculoskeletal system: Secondary | ICD-10-CM | POA: Diagnosis not present

## 2018-01-03 DIAGNOSIS — M6281 Muscle weakness (generalized): Secondary | ICD-10-CM | POA: Diagnosis not present

## 2018-01-03 DIAGNOSIS — R1312 Dysphagia, oropharyngeal phase: Secondary | ICD-10-CM | POA: Diagnosis not present

## 2018-01-03 DIAGNOSIS — Z9181 History of falling: Secondary | ICD-10-CM | POA: Diagnosis not present

## 2018-01-04 DIAGNOSIS — Z9181 History of falling: Secondary | ICD-10-CM | POA: Diagnosis not present

## 2018-01-04 DIAGNOSIS — R41841 Cognitive communication deficit: Secondary | ICD-10-CM | POA: Diagnosis not present

## 2018-01-04 DIAGNOSIS — M6281 Muscle weakness (generalized): Secondary | ICD-10-CM | POA: Diagnosis not present

## 2018-01-04 DIAGNOSIS — R29898 Other symptoms and signs involving the musculoskeletal system: Secondary | ICD-10-CM | POA: Diagnosis not present

## 2018-01-04 DIAGNOSIS — R1311 Dysphagia, oral phase: Secondary | ICD-10-CM | POA: Diagnosis not present

## 2018-01-04 DIAGNOSIS — R1312 Dysphagia, oropharyngeal phase: Secondary | ICD-10-CM | POA: Diagnosis not present

## 2018-01-05 DIAGNOSIS — R29898 Other symptoms and signs involving the musculoskeletal system: Secondary | ICD-10-CM | POA: Diagnosis not present

## 2018-01-05 DIAGNOSIS — Z9181 History of falling: Secondary | ICD-10-CM | POA: Diagnosis not present

## 2018-01-05 DIAGNOSIS — M6281 Muscle weakness (generalized): Secondary | ICD-10-CM | POA: Diagnosis not present

## 2018-01-05 DIAGNOSIS — R1311 Dysphagia, oral phase: Secondary | ICD-10-CM | POA: Diagnosis not present

## 2018-01-05 DIAGNOSIS — R1312 Dysphagia, oropharyngeal phase: Secondary | ICD-10-CM | POA: Diagnosis not present

## 2018-01-05 DIAGNOSIS — R41841 Cognitive communication deficit: Secondary | ICD-10-CM | POA: Diagnosis not present

## 2018-01-06 DIAGNOSIS — R29898 Other symptoms and signs involving the musculoskeletal system: Secondary | ICD-10-CM | POA: Diagnosis not present

## 2018-01-06 DIAGNOSIS — R41841 Cognitive communication deficit: Secondary | ICD-10-CM | POA: Diagnosis not present

## 2018-01-06 DIAGNOSIS — R1312 Dysphagia, oropharyngeal phase: Secondary | ICD-10-CM | POA: Diagnosis not present

## 2018-01-06 DIAGNOSIS — M6281 Muscle weakness (generalized): Secondary | ICD-10-CM | POA: Diagnosis not present

## 2018-01-06 DIAGNOSIS — Z9181 History of falling: Secondary | ICD-10-CM | POA: Diagnosis not present

## 2018-01-06 DIAGNOSIS — R1311 Dysphagia, oral phase: Secondary | ICD-10-CM | POA: Diagnosis not present

## 2018-01-07 DIAGNOSIS — M6281 Muscle weakness (generalized): Secondary | ICD-10-CM | POA: Diagnosis not present

## 2018-01-07 DIAGNOSIS — R1312 Dysphagia, oropharyngeal phase: Secondary | ICD-10-CM | POA: Diagnosis not present

## 2018-01-07 DIAGNOSIS — R29898 Other symptoms and signs involving the musculoskeletal system: Secondary | ICD-10-CM | POA: Diagnosis not present

## 2018-01-07 DIAGNOSIS — R1311 Dysphagia, oral phase: Secondary | ICD-10-CM | POA: Diagnosis not present

## 2018-01-07 DIAGNOSIS — Z9181 History of falling: Secondary | ICD-10-CM | POA: Diagnosis not present

## 2018-01-07 DIAGNOSIS — R41841 Cognitive communication deficit: Secondary | ICD-10-CM | POA: Diagnosis not present

## 2018-01-10 DIAGNOSIS — R1312 Dysphagia, oropharyngeal phase: Secondary | ICD-10-CM | POA: Diagnosis not present

## 2018-01-10 DIAGNOSIS — R41841 Cognitive communication deficit: Secondary | ICD-10-CM | POA: Diagnosis not present

## 2018-01-10 DIAGNOSIS — R29898 Other symptoms and signs involving the musculoskeletal system: Secondary | ICD-10-CM | POA: Diagnosis not present

## 2018-01-10 DIAGNOSIS — R1311 Dysphagia, oral phase: Secondary | ICD-10-CM | POA: Diagnosis not present

## 2018-01-10 DIAGNOSIS — M6281 Muscle weakness (generalized): Secondary | ICD-10-CM | POA: Diagnosis not present

## 2018-01-10 DIAGNOSIS — Z9181 History of falling: Secondary | ICD-10-CM | POA: Diagnosis not present

## 2018-01-11 DIAGNOSIS — R29898 Other symptoms and signs involving the musculoskeletal system: Secondary | ICD-10-CM | POA: Diagnosis not present

## 2018-01-11 DIAGNOSIS — R41841 Cognitive communication deficit: Secondary | ICD-10-CM | POA: Diagnosis not present

## 2018-01-11 DIAGNOSIS — R1312 Dysphagia, oropharyngeal phase: Secondary | ICD-10-CM | POA: Diagnosis not present

## 2018-01-11 DIAGNOSIS — Z9181 History of falling: Secondary | ICD-10-CM | POA: Diagnosis not present

## 2018-01-11 DIAGNOSIS — R1311 Dysphagia, oral phase: Secondary | ICD-10-CM | POA: Diagnosis not present

## 2018-01-11 DIAGNOSIS — M6281 Muscle weakness (generalized): Secondary | ICD-10-CM | POA: Diagnosis not present

## 2018-01-12 DIAGNOSIS — R1312 Dysphagia, oropharyngeal phase: Secondary | ICD-10-CM | POA: Diagnosis not present

## 2018-01-12 DIAGNOSIS — R29898 Other symptoms and signs involving the musculoskeletal system: Secondary | ICD-10-CM | POA: Diagnosis not present

## 2018-01-12 DIAGNOSIS — F0390 Unspecified dementia without behavioral disturbance: Secondary | ICD-10-CM | POA: Diagnosis not present

## 2018-01-12 DIAGNOSIS — Z9181 History of falling: Secondary | ICD-10-CM | POA: Diagnosis not present

## 2018-01-12 DIAGNOSIS — R1311 Dysphagia, oral phase: Secondary | ICD-10-CM | POA: Diagnosis not present

## 2018-01-12 DIAGNOSIS — F331 Major depressive disorder, recurrent, moderate: Secondary | ICD-10-CM | POA: Diagnosis not present

## 2018-01-12 DIAGNOSIS — R41841 Cognitive communication deficit: Secondary | ICD-10-CM | POA: Diagnosis not present

## 2018-01-12 DIAGNOSIS — M6281 Muscle weakness (generalized): Secondary | ICD-10-CM | POA: Diagnosis not present

## 2018-01-13 DIAGNOSIS — R29898 Other symptoms and signs involving the musculoskeletal system: Secondary | ICD-10-CM | POA: Diagnosis not present

## 2018-01-13 DIAGNOSIS — R1311 Dysphagia, oral phase: Secondary | ICD-10-CM | POA: Diagnosis not present

## 2018-01-13 DIAGNOSIS — R1312 Dysphagia, oropharyngeal phase: Secondary | ICD-10-CM | POA: Diagnosis not present

## 2018-01-13 DIAGNOSIS — R41841 Cognitive communication deficit: Secondary | ICD-10-CM | POA: Diagnosis not present

## 2018-01-13 DIAGNOSIS — Z9181 History of falling: Secondary | ICD-10-CM | POA: Diagnosis not present

## 2018-01-13 DIAGNOSIS — M6281 Muscle weakness (generalized): Secondary | ICD-10-CM | POA: Diagnosis not present

## 2018-01-14 DIAGNOSIS — R29898 Other symptoms and signs involving the musculoskeletal system: Secondary | ICD-10-CM | POA: Diagnosis not present

## 2018-01-14 DIAGNOSIS — I739 Peripheral vascular disease, unspecified: Secondary | ICD-10-CM | POA: Diagnosis not present

## 2018-01-14 DIAGNOSIS — Z9181 History of falling: Secondary | ICD-10-CM | POA: Diagnosis not present

## 2018-01-14 DIAGNOSIS — R1312 Dysphagia, oropharyngeal phase: Secondary | ICD-10-CM | POA: Diagnosis not present

## 2018-01-14 DIAGNOSIS — M6281 Muscle weakness (generalized): Secondary | ICD-10-CM | POA: Diagnosis not present

## 2018-01-14 DIAGNOSIS — L603 Nail dystrophy: Secondary | ICD-10-CM | POA: Diagnosis not present

## 2018-01-14 DIAGNOSIS — B351 Tinea unguium: Secondary | ICD-10-CM | POA: Diagnosis not present

## 2018-01-14 DIAGNOSIS — R41841 Cognitive communication deficit: Secondary | ICD-10-CM | POA: Diagnosis not present

## 2018-01-14 DIAGNOSIS — Q845 Enlarged and hypertrophic nails: Secondary | ICD-10-CM | POA: Diagnosis not present

## 2018-01-14 DIAGNOSIS — R1311 Dysphagia, oral phase: Secondary | ICD-10-CM | POA: Diagnosis not present

## 2018-01-18 DIAGNOSIS — R1311 Dysphagia, oral phase: Secondary | ICD-10-CM | POA: Diagnosis not present

## 2018-01-18 DIAGNOSIS — R1312 Dysphagia, oropharyngeal phase: Secondary | ICD-10-CM | POA: Diagnosis not present

## 2018-01-18 DIAGNOSIS — R41841 Cognitive communication deficit: Secondary | ICD-10-CM | POA: Diagnosis not present

## 2018-01-18 DIAGNOSIS — R29898 Other symptoms and signs involving the musculoskeletal system: Secondary | ICD-10-CM | POA: Diagnosis not present

## 2018-01-18 DIAGNOSIS — Z9181 History of falling: Secondary | ICD-10-CM | POA: Diagnosis not present

## 2018-01-18 DIAGNOSIS — M6281 Muscle weakness (generalized): Secondary | ICD-10-CM | POA: Diagnosis not present

## 2018-01-19 DIAGNOSIS — R41841 Cognitive communication deficit: Secondary | ICD-10-CM | POA: Diagnosis not present

## 2018-01-19 DIAGNOSIS — R29898 Other symptoms and signs involving the musculoskeletal system: Secondary | ICD-10-CM | POA: Diagnosis not present

## 2018-01-19 DIAGNOSIS — I4891 Unspecified atrial fibrillation: Secondary | ICD-10-CM | POA: Diagnosis not present

## 2018-01-19 DIAGNOSIS — E039 Hypothyroidism, unspecified: Secondary | ICD-10-CM | POA: Diagnosis not present

## 2018-01-19 DIAGNOSIS — I130 Hypertensive heart and chronic kidney disease with heart failure and stage 1 through stage 4 chronic kidney disease, or unspecified chronic kidney disease: Secondary | ICD-10-CM | POA: Diagnosis not present

## 2018-01-19 DIAGNOSIS — J449 Chronic obstructive pulmonary disease, unspecified: Secondary | ICD-10-CM | POA: Diagnosis not present

## 2018-01-19 DIAGNOSIS — F0390 Unspecified dementia without behavioral disturbance: Secondary | ICD-10-CM | POA: Diagnosis not present

## 2018-01-19 DIAGNOSIS — R1311 Dysphagia, oral phase: Secondary | ICD-10-CM | POA: Diagnosis not present

## 2018-01-19 DIAGNOSIS — R1312 Dysphagia, oropharyngeal phase: Secondary | ICD-10-CM | POA: Diagnosis not present

## 2018-01-19 DIAGNOSIS — Z9181 History of falling: Secondary | ICD-10-CM | POA: Diagnosis not present

## 2018-01-19 DIAGNOSIS — R2681 Unsteadiness on feet: Secondary | ICD-10-CM | POA: Diagnosis not present

## 2018-01-19 DIAGNOSIS — R278 Other lack of coordination: Secondary | ICD-10-CM | POA: Diagnosis not present

## 2018-01-19 DIAGNOSIS — M6281 Muscle weakness (generalized): Secondary | ICD-10-CM | POA: Diagnosis not present

## 2018-01-26 DIAGNOSIS — F331 Major depressive disorder, recurrent, moderate: Secondary | ICD-10-CM | POA: Diagnosis not present

## 2018-01-26 DIAGNOSIS — F0390 Unspecified dementia without behavioral disturbance: Secondary | ICD-10-CM | POA: Diagnosis not present

## 2018-02-17 DIAGNOSIS — E039 Hypothyroidism, unspecified: Secondary | ICD-10-CM | POA: Diagnosis not present

## 2018-02-17 DIAGNOSIS — E559 Vitamin D deficiency, unspecified: Secondary | ICD-10-CM | POA: Diagnosis not present

## 2018-02-17 DIAGNOSIS — D649 Anemia, unspecified: Secondary | ICD-10-CM | POA: Diagnosis not present

## 2018-02-17 DIAGNOSIS — I10 Essential (primary) hypertension: Secondary | ICD-10-CM | POA: Diagnosis not present

## 2018-02-21 ENCOUNTER — Encounter (HOSPITAL_COMMUNITY): Payer: Self-pay

## 2018-02-21 ENCOUNTER — Emergency Department (HOSPITAL_COMMUNITY)
Admission: EM | Admit: 2018-02-21 | Discharge: 2018-02-21 | Disposition: A | Discharge status: Skilled Nursing Facility | Payer: Medicare Other | Attending: Emergency Medicine | Admitting: Emergency Medicine

## 2018-02-21 ENCOUNTER — Other Ambulatory Visit: Payer: Self-pay

## 2018-02-21 DIAGNOSIS — Z79899 Other long term (current) drug therapy: Secondary | ICD-10-CM | POA: Diagnosis not present

## 2018-02-21 DIAGNOSIS — Y929 Unspecified place or not applicable: Secondary | ICD-10-CM | POA: Diagnosis not present

## 2018-02-21 DIAGNOSIS — W228XXA Striking against or struck by other objects, initial encounter: Secondary | ICD-10-CM | POA: Insufficient documentation

## 2018-02-21 DIAGNOSIS — Y999 Unspecified external cause status: Secondary | ICD-10-CM | POA: Diagnosis not present

## 2018-02-21 DIAGNOSIS — I130 Hypertensive heart and chronic kidney disease with heart failure and stage 1 through stage 4 chronic kidney disease, or unspecified chronic kidney disease: Secondary | ICD-10-CM | POA: Diagnosis not present

## 2018-02-21 DIAGNOSIS — N183 Chronic kidney disease, stage 3 (moderate): Secondary | ICD-10-CM | POA: Diagnosis not present

## 2018-02-21 DIAGNOSIS — I509 Heart failure, unspecified: Secondary | ICD-10-CM | POA: Insufficient documentation

## 2018-02-21 DIAGNOSIS — R402441 Other coma, without documented Glasgow coma scale score, or with partial score reported, in the field [EMT or ambulance]: Secondary | ICD-10-CM | POA: Diagnosis not present

## 2018-02-21 DIAGNOSIS — E039 Hypothyroidism, unspecified: Secondary | ICD-10-CM | POA: Diagnosis not present

## 2018-02-21 DIAGNOSIS — S8991XA Unspecified injury of right lower leg, initial encounter: Secondary | ICD-10-CM | POA: Diagnosis present

## 2018-02-21 DIAGNOSIS — Y9389 Activity, other specified: Secondary | ICD-10-CM | POA: Diagnosis not present

## 2018-02-21 DIAGNOSIS — Z87891 Personal history of nicotine dependence: Secondary | ICD-10-CM | POA: Insufficient documentation

## 2018-02-21 DIAGNOSIS — Z23 Encounter for immunization: Secondary | ICD-10-CM | POA: Insufficient documentation

## 2018-02-21 DIAGNOSIS — R58 Hemorrhage, not elsewhere classified: Secondary | ICD-10-CM | POA: Diagnosis not present

## 2018-02-21 DIAGNOSIS — S81801A Unspecified open wound, right lower leg, initial encounter: Secondary | ICD-10-CM | POA: Diagnosis not present

## 2018-02-21 DIAGNOSIS — S81811A Laceration without foreign body, right lower leg, initial encounter: Secondary | ICD-10-CM | POA: Insufficient documentation

## 2018-02-21 HISTORY — DX: Muscle weakness (generalized): M62.81

## 2018-02-21 HISTORY — DX: Dysphagia, unspecified: R13.10

## 2018-02-21 HISTORY — DX: Unspecified fall, initial encounter: W19.XXXA

## 2018-02-21 MED ORDER — LIDOCAINE-EPINEPHRINE 2 %-1:100000 IJ SOLN
20.0000 mL | Freq: Once | INTRAMUSCULAR | Status: AC
  Administered 2018-02-21: 20 mL via INTRADERMAL
  Filled 2018-02-21: qty 20

## 2018-02-21 MED ORDER — TETANUS-DIPHTH-ACELL PERTUSSIS 5-2.5-18.5 LF-MCG/0.5 IM SUSP
0.5000 mL | Freq: Once | INTRAMUSCULAR | Status: AC
  Administered 2018-02-21: 0.5 mL via INTRAMUSCULAR
  Filled 2018-02-21: qty 0.5

## 2018-02-21 MED ORDER — CEPHALEXIN 500 MG PO CAPS: 500.0000 mg | ORAL_CAPSULE | Freq: Four times a day (QID) | ORAL | 0 refills | Status: AC

## 2018-02-21 NOTE — ED Notes (Signed)
EDPA Provider at bedside. 

## 2018-02-21 NOTE — ED Notes (Signed)
Bed: [facility] Expected date:  Expected time:  Means of arrival:  Comments: held 

## 2018-02-21 NOTE — ED Provider Notes (Signed)
[facility]-EMERGENCY DEPT Provider Note   CSN: [id number] Arrival date & time: 02/21/18  1435     History   Chief Complaint Chief Complaint  Patient presents with  . Extremity Laceration    HPI Christina [name] is a 84 y.o. female.  HPI   84 year old female with a history of A. fib, CHF, hypothyroid who presents the emergency department today via EMS [facility] evaluated for laceration which occurred prior to arrival.  Patient was transferring from her wheelchair with assistance when her right lower extremity the wheelchair and she sustained a laceration to the area.  States bleeding is controlled.  No numbness or weakness to the right lower extremity.  No other injuries.  No falls.  EMS did confirm with [facility] that patient did not actually fall.  Patient denies any other injuries or symptoms currently. No head trauma or LOC. Not anticoagulated.   Past Medical History:  Diagnosis Date  . Atrial fibrillation (HCC)   . Atrial fibrillation (HCC)   . CHF (congestive heart failure) (HCC)   . Chronic low back pain   . CKD (chronic kidney disease) stage 3, GFR 30-59 ml/min (HCC)   . Dysphagia   . Fall   . History of alcohol abuse   . History of narcotic addiction (HCC)   . Hypertension   . Hypothyroidism   . Muscle weakness   . SBO (small bowel obstruction) (HCC)   . Thyroid disease     Patient Active Problem List   Diagnosis Date Noted  . Chronic respiratory failure with hypoxia (HCC) 06/08/2017  . Acute on chronic kidney disease, stage 3 10/19/2011  . CHF (congestive heart failure) (HCC) 10/19/2011  . Shortness of breath 10/18/2011  . A-fib (HCC) 10/18/2011  . Essential hypertension 10/18/2011  . Hypothyroidism 10/18/2011    Past Surgical History:  Procedure Laterality Date  . ABDOMINAL HYSTERECTOMY    . BACK SURGERY    . ROTATOR CUFF REPAIR       OB History   None      Home Medications    Prior  to Admission medications   Medication Sig Start Date End Date Taking? Authorizing Provider  acetaminophen (TYLENOL) 325 MG tablet Take 325 mg by mouth 3 (three) times daily.    Yes [provider]  furosemide (LASIX) 40 MG tablet Take 40 mg by mouth daily.   Yes [provider]  guaiFENesin (MUCINEX) 600 MG 12 hr tablet Take 600 mg by mouth 2 (two) times daily.   Yes [provider]  levothyroxine (SYNTHROID, LEVOTHROID) 50 MCG tablet Take 50 mcg by mouth daily.    Yes [provider]  losartan (COZAAR) 50 MG tablet Take 1 tablet (50 mg total) by mouth daily. 06/07/17  Yes Nyoka CowdenWert, Michael B, MD  Multiple Vitamins-Minerals (PRESERVISION AREDS) TABS Take 1 tablet by mouth daily.   Yes [provider]  OXYGEN Inhale into the lungs. 2 liters at bedtime   Yes [provider]  Polyethyl Glycol-Propyl Glycol (SYSTANE) 0.4-0.3 % GEL ophthalmic gel Place 1 application into both eyes daily.   Yes [provider]  sertraline (ZOLOFT) 25 MG tablet Take 25 mg by mouth daily.   Yes [provider]  Vitamin D, Ergocalciferol, (DRISDOL) 50000 units CAPS capsule Take 50,000 Units by mouth every 7 (seven) days.   Yes [provider]  cephALEXin (KEFLEX) 500 MG capsule Take 1 capsule (500 mg total) by mouth 4 (four)  times daily for 7 days. 02/21/18 02/28/18  Dewaun Kinzler S, PA-C  ipratropium-albuterol (DUONEB) 0.5-2.5 (3) MG/3ML SOLN Take 3 mLs by nebulization every 6 (six) hours as needed. 06/07/17   Nyoka Cowden, MD    Family History No family history on file.  Social History Social History   Tobacco Use  . Smoking status: Former Smoker    Packs/day: 1.00    Years: 38.00    Pack years: 38.00    Types: Cigarettes    Last attempt to quit: 09/21/1978    Years since quitting: 39.4  . Smokeless tobacco: Never Used  Substance Use Topics  . Alcohol use: No    Comment: former etoh abuse  . Drug use: No     Allergies     Duragesic-100 [fentanyl]; Lisinopril; Lyrica [pregabalin]; Sudafed [pseudoephedrine hcl]; and Macrodantin   Review of Systems Review of Systems  Constitutional: Negative for fever.  HENT: Negative for ear pain and sore throat.   Eyes: Negative for pain and visual disturbance.  Respiratory: Negative for shortness of breath.   Cardiovascular: Negative for chest pain.  Gastrointestinal: Negative for abdominal pain, nausea and vomiting.  Genitourinary: Negative for dysuria and hematuria.  Musculoskeletal: Negative for arthralgias and back pain.  Skin: Positive for wound.  Neurological:       No head trauma or loc  All other systems reviewed and are negative.  Physical Exam Updated Vital Signs BP (!) 154/72 (BP Location: Left Arm)   Pulse 72   Temp 98 F (36.7 C) (Oral)   Resp 14   Ht 5\' 3"  (1.6 m)   Wt 86.2 kg (190 lb)   SpO2 98%   BMI 33.66 kg/m   Physical Exam  Constitutional: She appears well-developed and well-nourished. No distress.  NAD, nontoxic  HENT:  Head: Normocephalic and atraumatic.  Right Ear: External ear normal.  Left Ear: External ear normal.  Mouth/Throat: Oropharynx is clear and moist.  Eyes: Conjunctivae are normal.  Neck: Neck supple.  Cardiovascular: Normal rate, regular rhythm, normal heart sounds and intact distal pulses.  No murmur heard. Pulmonary/Chest: Effort normal and breath sounds normal. No respiratory distress. She has no wheezes.  Abdominal: Soft. Bowel sounds are normal. There is no tenderness.  Musculoskeletal: Normal range of motion.  No bony TTP to tib/fib. 5/5 strength with dorsiflexion/plantarflexion, knee flexion/extension. Distal pulses intact. Normal sensation.   Neurological: She is alert.  Skin: Skin is warm and dry. Capillary refill takes less than 2 seconds.  V-shaped skin tear that is 6cm by 6cm to the lateral portion of the shin on the RLE. No active bleeding. No FB noted through FROM.   Psychiatric: She has a normal  mood and affect.  Nursing note and vitals reviewed.    ED Treatments / Results  Labs (all labs ordered are listed, but only abnormal results are displayed) Labs Reviewed - No data to display  EKG None  Radiology No results found.  Procedures Procedures (including critical care time)  Medications Ordered in ED Medications  Tdap (BOOSTRIX) injection 0.5 mL (has no administration in time range)  lidocaine-EPINEPHrine (XYLOCAINE W/EPI) 2 %-1:100000 (with pres) injection 20 mL (has no administration in time range)   Initial Impression / Assessment and Plan / ED Course  I have reviewed the triage vital signs and the nursing notes.  Pertinent labs & imaging results that were available during my care of the patient were reviewed by me and considered in my medical decision making (see chart for  details).  Clinical Course as of Feb 22 1735  Mon Feb 21, 2018  2350 84 year old female here with the flap skin tear of her right lower extremity.  The tissue is very thin and I dont think would support sutures so we will Steri-Strip and bandage her up.  We discussed that this area will be very slow to heal and she should keep an eye out for infection.   [MB]    Clinical Course User Index [MB] Terrilee Files, MD   Discussed pt presentation and exam findings with Dr. Charm Barges, who evaluated injury and recommends steri strips and close f/u.   Final Clinical Impressions(s) / ED Diagnoses   Final diagnoses:  Noninfected skin tear of leg, right, initial encounter   Pt is a 84 year old female presenting with skin tear to the right lower extremity which occurred while she was transferring from her wheelchair prior to arrival.  Vital signs are stable and she is afebrile.  Bleeding is controlled.  There was no fall associated with this injury.  Patient has no other complaints besides right lower extremity wound.   Wound cleansed with sur cleanse and pressure irrigation performed copious amount of  saline . Wound explored and base of wound visualized in a bloodless field without evidence of foreign body.  Tdap updated. Pt adamantly declined imaging to assess for foreign body. None were observed on exam and feel that this is reasonable given mechanism of injury and low risk of FB in wound.  Pt discharged with antibiotics to prevent infection.  Gave info for wound care and advised f/u in 48 hours for recheck. they are to return to the ED sooner for signs of infection. Pt is hemodynamically stable with no complaints prior to dc.   ED Discharge Orders        Ordered    cephALEXin (KEFLEX) 500 MG capsule  4 times daily     02/21/18 [address], [place], PA-C 02/21/18 1736    Terrilee Files, MD 02/22/18 [phone]

## 2018-02-21 NOTE — Discharge Instructions (Addendum)
You will be sent home with a prescription for an antibiotic to help prevent an infection of the wound on your leg.  You will need to keep the Steri-Strips on until they fall off on their own.  Please make sure that you keep the wound covered and dressed with a dressing at all times until this heals.  Please monitor the area for signs of infection such as increased redness, swelling, warmth, drainage of pus from the area, or if you have fevers.  If you have any signs of infection then you will need to return to the emergency department immediately for reevaluation.  Please follow-up with your primary care doctor in 48 hours for a wound recheck.  If you cannot get an appointment your primary care doctor in this timeframe then you may follow-up in the emergency department or at urgent care for a wound recheck.

## 2018-02-21 NOTE — ED Notes (Signed)
[facility] Expected date:  Expected time:  Means of arrival:  Comments: EMS-leg laceration-triage

## 2018-02-21 NOTE — ED Triage Notes (Signed)
Per [facility]- PT is a DNR/MOST forms present. Per staff pt was being assisted with transferring from wheelchair to bed and during event cut her LLE. Pt normally is assist x 2. This event pt was assist x 1. Dressing present when EMS arrived. Bleeding controlled. Good CMS. No other trauma noted. Pt was able to verbalized this was not a fall. Staff verified not a fall.

## 2018-02-21 NOTE — ED Notes (Signed)
RX X 1 AND DNR AND MOST IN PT'S CHART

## 2018-02-21 NOTE — ED Notes (Addendum)
ED Provider at bedside. EDPA AND EDP AT BEDSIDE EVALUATING LACERATION

## 2018-02-21 NOTE — ED Notes (Signed)
Called for update on patient PTAR status. Patient next on the list.

## 2018-02-22 DIAGNOSIS — I4891 Unspecified atrial fibrillation: Secondary | ICD-10-CM | POA: Diagnosis not present

## 2018-02-22 DIAGNOSIS — I10 Essential (primary) hypertension: Secondary | ICD-10-CM | POA: Diagnosis not present

## 2018-02-22 DIAGNOSIS — S81811A Laceration without foreign body, right lower leg, initial encounter: Secondary | ICD-10-CM | POA: Diagnosis not present

## 2018-02-22 DIAGNOSIS — I509 Heart failure, unspecified: Secondary | ICD-10-CM | POA: Diagnosis not present

## 2018-02-23 DIAGNOSIS — J449 Chronic obstructive pulmonary disease, unspecified: Secondary | ICD-10-CM | POA: Diagnosis not present

## 2018-02-23 DIAGNOSIS — I4891 Unspecified atrial fibrillation: Secondary | ICD-10-CM | POA: Diagnosis not present

## 2018-02-23 DIAGNOSIS — Z66 Do not resuscitate: Secondary | ICD-10-CM | POA: Diagnosis not present

## 2018-02-23 DIAGNOSIS — I10 Essential (primary) hypertension: Secondary | ICD-10-CM | POA: Diagnosis not present

## 2018-02-23 DIAGNOSIS — F331 Major depressive disorder, recurrent, moderate: Secondary | ICD-10-CM | POA: Diagnosis not present

## 2018-02-23 DIAGNOSIS — F0390 Unspecified dementia without behavioral disturbance: Secondary | ICD-10-CM | POA: Diagnosis not present

## 2018-02-23 DIAGNOSIS — E039 Hypothyroidism, unspecified: Secondary | ICD-10-CM | POA: Diagnosis not present

## 2018-03-03 DIAGNOSIS — I509 Heart failure, unspecified: Secondary | ICD-10-CM | POA: Diagnosis not present

## 2018-03-03 DIAGNOSIS — I4891 Unspecified atrial fibrillation: Secondary | ICD-10-CM | POA: Diagnosis not present

## 2018-03-03 DIAGNOSIS — S81811A Laceration without foreign body, right lower leg, initial encounter: Secondary | ICD-10-CM | POA: Diagnosis not present

## 2018-03-03 DIAGNOSIS — R635 Abnormal weight gain: Secondary | ICD-10-CM | POA: Diagnosis not present

## 2018-03-09 DIAGNOSIS — F0390 Unspecified dementia without behavioral disturbance: Secondary | ICD-10-CM | POA: Diagnosis not present

## 2018-03-09 DIAGNOSIS — F331 Major depressive disorder, recurrent, moderate: Secondary | ICD-10-CM | POA: Diagnosis not present

## 2018-03-16 DIAGNOSIS — S81801A Unspecified open wound, right lower leg, initial encounter: Secondary | ICD-10-CM | POA: Diagnosis not present

## 2018-03-16 DIAGNOSIS — R05 Cough: Secondary | ICD-10-CM | POA: Diagnosis not present

## 2018-03-16 DIAGNOSIS — S81811D Laceration without foreign body, right lower leg, subsequent encounter: Secondary | ICD-10-CM | POA: Diagnosis not present

## 2018-03-16 DIAGNOSIS — I4891 Unspecified atrial fibrillation: Secondary | ICD-10-CM | POA: Diagnosis not present

## 2018-03-16 DIAGNOSIS — W06XXXA Fall from bed, initial encounter: Secondary | ICD-10-CM | POA: Diagnosis not present

## 2018-03-16 DIAGNOSIS — I509 Heart failure, unspecified: Secondary | ICD-10-CM | POA: Diagnosis not present

## 2018-03-16 DIAGNOSIS — S81801D Unspecified open wound, right lower leg, subsequent encounter: Secondary | ICD-10-CM | POA: Diagnosis not present

## 2018-03-20 DIAGNOSIS — R062 Wheezing: Secondary | ICD-10-CM | POA: Diagnosis not present

## 2018-03-23 DIAGNOSIS — S81801A Unspecified open wound, right lower leg, initial encounter: Secondary | ICD-10-CM | POA: Diagnosis not present

## 2018-03-23 DIAGNOSIS — S81801D Unspecified open wound, right lower leg, subsequent encounter: Secondary | ICD-10-CM | POA: Diagnosis not present

## 2018-03-23 DIAGNOSIS — S00219A Abrasion of unspecified eyelid and periocular area, initial encounter: Secondary | ICD-10-CM | POA: Diagnosis not present

## 2018-03-30 DIAGNOSIS — S81801A Unspecified open wound, right lower leg, initial encounter: Secondary | ICD-10-CM | POA: Diagnosis not present

## 2018-03-30 DIAGNOSIS — S81801D Unspecified open wound, right lower leg, subsequent encounter: Secondary | ICD-10-CM | POA: Diagnosis not present

## 2018-04-06 DIAGNOSIS — S81801A Unspecified open wound, right lower leg, initial encounter: Secondary | ICD-10-CM | POA: Diagnosis not present

## 2018-04-06 DIAGNOSIS — S81801D Unspecified open wound, right lower leg, subsequent encounter: Secondary | ICD-10-CM | POA: Diagnosis not present

## 2018-04-07 DIAGNOSIS — I130 Hypertensive heart and chronic kidney disease with heart failure and stage 1 through stage 4 chronic kidney disease, or unspecified chronic kidney disease: Secondary | ICD-10-CM | POA: Diagnosis not present

## 2018-04-07 DIAGNOSIS — Z9181 History of falling: Secondary | ICD-10-CM | POA: Diagnosis not present

## 2018-04-07 DIAGNOSIS — F0390 Unspecified dementia without behavioral disturbance: Secondary | ICD-10-CM | POA: Diagnosis not present

## 2018-04-07 DIAGNOSIS — J449 Chronic obstructive pulmonary disease, unspecified: Secondary | ICD-10-CM | POA: Diagnosis not present

## 2018-04-07 DIAGNOSIS — R41841 Cognitive communication deficit: Secondary | ICD-10-CM | POA: Diagnosis not present

## 2018-04-07 DIAGNOSIS — R2681 Unsteadiness on feet: Secondary | ICD-10-CM | POA: Diagnosis not present

## 2018-04-07 DIAGNOSIS — R29898 Other symptoms and signs involving the musculoskeletal system: Secondary | ICD-10-CM | POA: Diagnosis not present

## 2018-04-07 DIAGNOSIS — R1311 Dysphagia, oral phase: Secondary | ICD-10-CM | POA: Diagnosis not present

## 2018-04-07 DIAGNOSIS — M6281 Muscle weakness (generalized): Secondary | ICD-10-CM | POA: Diagnosis not present

## 2018-04-07 DIAGNOSIS — E039 Hypothyroidism, unspecified: Secondary | ICD-10-CM | POA: Diagnosis not present

## 2018-04-07 DIAGNOSIS — R1312 Dysphagia, oropharyngeal phase: Secondary | ICD-10-CM | POA: Diagnosis not present

## 2018-04-07 DIAGNOSIS — I4891 Unspecified atrial fibrillation: Secondary | ICD-10-CM | POA: Diagnosis not present

## 2018-04-08 DIAGNOSIS — R41841 Cognitive communication deficit: Secondary | ICD-10-CM | POA: Diagnosis not present

## 2018-04-08 DIAGNOSIS — R29898 Other symptoms and signs involving the musculoskeletal system: Secondary | ICD-10-CM | POA: Diagnosis not present

## 2018-04-08 DIAGNOSIS — R1311 Dysphagia, oral phase: Secondary | ICD-10-CM | POA: Diagnosis not present

## 2018-04-08 DIAGNOSIS — Z9181 History of falling: Secondary | ICD-10-CM | POA: Diagnosis not present

## 2018-04-08 DIAGNOSIS — R1312 Dysphagia, oropharyngeal phase: Secondary | ICD-10-CM | POA: Diagnosis not present

## 2018-04-08 DIAGNOSIS — M6281 Muscle weakness (generalized): Secondary | ICD-10-CM | POA: Diagnosis not present

## 2018-04-11 DIAGNOSIS — R1311 Dysphagia, oral phase: Secondary | ICD-10-CM | POA: Diagnosis not present

## 2018-04-11 DIAGNOSIS — R41841 Cognitive communication deficit: Secondary | ICD-10-CM | POA: Diagnosis not present

## 2018-04-11 DIAGNOSIS — M6281 Muscle weakness (generalized): Secondary | ICD-10-CM | POA: Diagnosis not present

## 2018-04-11 DIAGNOSIS — R1312 Dysphagia, oropharyngeal phase: Secondary | ICD-10-CM | POA: Diagnosis not present

## 2018-04-11 DIAGNOSIS — Z9181 History of falling: Secondary | ICD-10-CM | POA: Diagnosis not present

## 2018-04-11 DIAGNOSIS — R29898 Other symptoms and signs involving the musculoskeletal system: Secondary | ICD-10-CM | POA: Diagnosis not present

## 2018-04-12 DIAGNOSIS — R29898 Other symptoms and signs involving the musculoskeletal system: Secondary | ICD-10-CM | POA: Diagnosis not present

## 2018-04-12 DIAGNOSIS — R1311 Dysphagia, oral phase: Secondary | ICD-10-CM | POA: Diagnosis not present

## 2018-04-12 DIAGNOSIS — M6281 Muscle weakness (generalized): Secondary | ICD-10-CM | POA: Diagnosis not present

## 2018-04-12 DIAGNOSIS — R41841 Cognitive communication deficit: Secondary | ICD-10-CM | POA: Diagnosis not present

## 2018-04-12 DIAGNOSIS — R1312 Dysphagia, oropharyngeal phase: Secondary | ICD-10-CM | POA: Diagnosis not present

## 2018-04-12 DIAGNOSIS — Z9181 History of falling: Secondary | ICD-10-CM | POA: Diagnosis not present

## 2018-04-13 DIAGNOSIS — R1312 Dysphagia, oropharyngeal phase: Secondary | ICD-10-CM | POA: Diagnosis not present

## 2018-04-13 DIAGNOSIS — M6281 Muscle weakness (generalized): Secondary | ICD-10-CM | POA: Diagnosis not present

## 2018-04-13 DIAGNOSIS — R41841 Cognitive communication deficit: Secondary | ICD-10-CM | POA: Diagnosis not present

## 2018-04-13 DIAGNOSIS — Z9181 History of falling: Secondary | ICD-10-CM | POA: Diagnosis not present

## 2018-04-13 DIAGNOSIS — R29898 Other symptoms and signs involving the musculoskeletal system: Secondary | ICD-10-CM | POA: Diagnosis not present

## 2018-04-13 DIAGNOSIS — R1311 Dysphagia, oral phase: Secondary | ICD-10-CM | POA: Diagnosis not present

## 2018-04-13 DIAGNOSIS — S81801A Unspecified open wound, right lower leg, initial encounter: Secondary | ICD-10-CM | POA: Diagnosis not present

## 2018-04-13 DIAGNOSIS — S81801D Unspecified open wound, right lower leg, subsequent encounter: Secondary | ICD-10-CM | POA: Diagnosis not present

## 2018-04-14 DIAGNOSIS — R29898 Other symptoms and signs involving the musculoskeletal system: Secondary | ICD-10-CM | POA: Diagnosis not present

## 2018-04-14 DIAGNOSIS — M6281 Muscle weakness (generalized): Secondary | ICD-10-CM | POA: Diagnosis not present

## 2018-04-14 DIAGNOSIS — R41841 Cognitive communication deficit: Secondary | ICD-10-CM | POA: Diagnosis not present

## 2018-04-14 DIAGNOSIS — Z9181 History of falling: Secondary | ICD-10-CM | POA: Diagnosis not present

## 2018-04-14 DIAGNOSIS — R1312 Dysphagia, oropharyngeal phase: Secondary | ICD-10-CM | POA: Diagnosis not present

## 2018-04-14 DIAGNOSIS — R1311 Dysphagia, oral phase: Secondary | ICD-10-CM | POA: Diagnosis not present

## 2018-04-15 DIAGNOSIS — M6281 Muscle weakness (generalized): Secondary | ICD-10-CM | POA: Diagnosis not present

## 2018-04-15 DIAGNOSIS — R29898 Other symptoms and signs involving the musculoskeletal system: Secondary | ICD-10-CM | POA: Diagnosis not present

## 2018-04-15 DIAGNOSIS — R1311 Dysphagia, oral phase: Secondary | ICD-10-CM | POA: Diagnosis not present

## 2018-04-15 DIAGNOSIS — R41841 Cognitive communication deficit: Secondary | ICD-10-CM | POA: Diagnosis not present

## 2018-04-15 DIAGNOSIS — Z9181 History of falling: Secondary | ICD-10-CM | POA: Diagnosis not present

## 2018-04-15 DIAGNOSIS — R1312 Dysphagia, oropharyngeal phase: Secondary | ICD-10-CM | POA: Diagnosis not present

## 2018-04-18 DIAGNOSIS — R635 Abnormal weight gain: Secondary | ICD-10-CM | POA: Diagnosis not present

## 2018-04-18 DIAGNOSIS — M6281 Muscle weakness (generalized): Secondary | ICD-10-CM | POA: Diagnosis not present

## 2018-04-18 DIAGNOSIS — I4891 Unspecified atrial fibrillation: Secondary | ICD-10-CM | POA: Diagnosis not present

## 2018-04-18 DIAGNOSIS — Z9181 History of falling: Secondary | ICD-10-CM | POA: Diagnosis not present

## 2018-04-18 DIAGNOSIS — R600 Localized edema: Secondary | ICD-10-CM | POA: Diagnosis not present

## 2018-04-18 DIAGNOSIS — R1312 Dysphagia, oropharyngeal phase: Secondary | ICD-10-CM | POA: Diagnosis not present

## 2018-04-18 DIAGNOSIS — R41841 Cognitive communication deficit: Secondary | ICD-10-CM | POA: Diagnosis not present

## 2018-04-18 DIAGNOSIS — I509 Heart failure, unspecified: Secondary | ICD-10-CM | POA: Diagnosis not present

## 2018-04-18 DIAGNOSIS — R1311 Dysphagia, oral phase: Secondary | ICD-10-CM | POA: Diagnosis not present

## 2018-04-18 DIAGNOSIS — R29898 Other symptoms and signs involving the musculoskeletal system: Secondary | ICD-10-CM | POA: Diagnosis not present

## 2018-04-19 DIAGNOSIS — Z9181 History of falling: Secondary | ICD-10-CM | POA: Diagnosis not present

## 2018-04-19 DIAGNOSIS — R29898 Other symptoms and signs involving the musculoskeletal system: Secondary | ICD-10-CM | POA: Diagnosis not present

## 2018-04-19 DIAGNOSIS — R1311 Dysphagia, oral phase: Secondary | ICD-10-CM | POA: Diagnosis not present

## 2018-04-19 DIAGNOSIS — I10 Essential (primary) hypertension: Secondary | ICD-10-CM | POA: Diagnosis not present

## 2018-04-19 DIAGNOSIS — R635 Abnormal weight gain: Secondary | ICD-10-CM | POA: Diagnosis not present

## 2018-04-19 DIAGNOSIS — E039 Hypothyroidism, unspecified: Secondary | ICD-10-CM | POA: Diagnosis not present

## 2018-04-19 DIAGNOSIS — R41841 Cognitive communication deficit: Secondary | ICD-10-CM | POA: Diagnosis not present

## 2018-04-19 DIAGNOSIS — N183 Chronic kidney disease, stage 3 (moderate): Secondary | ICD-10-CM | POA: Diagnosis not present

## 2018-04-19 DIAGNOSIS — M6281 Muscle weakness (generalized): Secondary | ICD-10-CM | POA: Diagnosis not present

## 2018-04-19 DIAGNOSIS — I509 Heart failure, unspecified: Secondary | ICD-10-CM | POA: Diagnosis not present

## 2018-04-19 DIAGNOSIS — R0602 Shortness of breath: Secondary | ICD-10-CM | POA: Diagnosis not present

## 2018-04-19 DIAGNOSIS — E559 Vitamin D deficiency, unspecified: Secondary | ICD-10-CM | POA: Diagnosis not present

## 2018-04-19 DIAGNOSIS — R1312 Dysphagia, oropharyngeal phase: Secondary | ICD-10-CM | POA: Diagnosis not present

## 2018-04-20 DIAGNOSIS — E039 Hypothyroidism, unspecified: Secondary | ICD-10-CM | POA: Diagnosis not present

## 2018-04-20 DIAGNOSIS — R1312 Dysphagia, oropharyngeal phase: Secondary | ICD-10-CM | POA: Diagnosis not present

## 2018-04-20 DIAGNOSIS — I130 Hypertensive heart and chronic kidney disease with heart failure and stage 1 through stage 4 chronic kidney disease, or unspecified chronic kidney disease: Secondary | ICD-10-CM | POA: Diagnosis not present

## 2018-04-20 DIAGNOSIS — I4891 Unspecified atrial fibrillation: Secondary | ICD-10-CM | POA: Diagnosis not present

## 2018-04-20 DIAGNOSIS — R41841 Cognitive communication deficit: Secondary | ICD-10-CM | POA: Diagnosis not present

## 2018-04-20 DIAGNOSIS — S81801A Unspecified open wound, right lower leg, initial encounter: Secondary | ICD-10-CM | POA: Diagnosis not present

## 2018-04-20 DIAGNOSIS — S81801D Unspecified open wound, right lower leg, subsequent encounter: Secondary | ICD-10-CM | POA: Diagnosis not present

## 2018-04-20 DIAGNOSIS — I509 Heart failure, unspecified: Secondary | ICD-10-CM | POA: Diagnosis not present

## 2018-04-20 DIAGNOSIS — R29898 Other symptoms and signs involving the musculoskeletal system: Secondary | ICD-10-CM | POA: Diagnosis not present

## 2018-04-20 DIAGNOSIS — J449 Chronic obstructive pulmonary disease, unspecified: Secondary | ICD-10-CM | POA: Diagnosis not present

## 2018-04-20 DIAGNOSIS — R1311 Dysphagia, oral phase: Secondary | ICD-10-CM | POA: Diagnosis not present

## 2018-04-20 DIAGNOSIS — F0390 Unspecified dementia without behavioral disturbance: Secondary | ICD-10-CM | POA: Diagnosis not present

## 2018-04-20 DIAGNOSIS — F331 Major depressive disorder, recurrent, moderate: Secondary | ICD-10-CM | POA: Diagnosis not present

## 2018-04-20 DIAGNOSIS — R2681 Unsteadiness on feet: Secondary | ICD-10-CM | POA: Diagnosis not present

## 2018-04-20 DIAGNOSIS — M6281 Muscle weakness (generalized): Secondary | ICD-10-CM | POA: Diagnosis not present

## 2018-04-20 DIAGNOSIS — I10 Essential (primary) hypertension: Secondary | ICD-10-CM | POA: Diagnosis not present

## 2018-04-20 DIAGNOSIS — Z9181 History of falling: Secondary | ICD-10-CM | POA: Diagnosis not present

## 2018-04-20 DIAGNOSIS — Z66 Do not resuscitate: Secondary | ICD-10-CM | POA: Diagnosis not present

## 2018-04-21 DIAGNOSIS — E039 Hypothyroidism, unspecified: Secondary | ICD-10-CM | POA: Diagnosis not present

## 2018-04-21 DIAGNOSIS — J449 Chronic obstructive pulmonary disease, unspecified: Secondary | ICD-10-CM | POA: Diagnosis not present

## 2018-04-21 DIAGNOSIS — F0390 Unspecified dementia without behavioral disturbance: Secondary | ICD-10-CM | POA: Diagnosis not present

## 2018-04-21 DIAGNOSIS — R41841 Cognitive communication deficit: Secondary | ICD-10-CM | POA: Diagnosis not present

## 2018-04-21 DIAGNOSIS — I130 Hypertensive heart and chronic kidney disease with heart failure and stage 1 through stage 4 chronic kidney disease, or unspecified chronic kidney disease: Secondary | ICD-10-CM | POA: Diagnosis not present

## 2018-04-21 DIAGNOSIS — R1311 Dysphagia, oral phase: Secondary | ICD-10-CM | POA: Diagnosis not present

## 2018-04-21 DIAGNOSIS — Z9181 History of falling: Secondary | ICD-10-CM | POA: Diagnosis not present

## 2018-04-21 DIAGNOSIS — R1312 Dysphagia, oropharyngeal phase: Secondary | ICD-10-CM | POA: Diagnosis not present

## 2018-04-21 DIAGNOSIS — M6281 Muscle weakness (generalized): Secondary | ICD-10-CM | POA: Diagnosis not present

## 2018-04-21 DIAGNOSIS — R29898 Other symptoms and signs involving the musculoskeletal system: Secondary | ICD-10-CM | POA: Diagnosis not present

## 2018-04-21 DIAGNOSIS — I4891 Unspecified atrial fibrillation: Secondary | ICD-10-CM | POA: Diagnosis not present

## 2018-04-21 DIAGNOSIS — R2681 Unsteadiness on feet: Secondary | ICD-10-CM | POA: Diagnosis not present

## 2018-04-22 DIAGNOSIS — R29898 Other symptoms and signs involving the musculoskeletal system: Secondary | ICD-10-CM | POA: Diagnosis not present

## 2018-04-22 DIAGNOSIS — R1312 Dysphagia, oropharyngeal phase: Secondary | ICD-10-CM | POA: Diagnosis not present

## 2018-04-22 DIAGNOSIS — M6281 Muscle weakness (generalized): Secondary | ICD-10-CM | POA: Diagnosis not present

## 2018-04-22 DIAGNOSIS — Z9181 History of falling: Secondary | ICD-10-CM | POA: Diagnosis not present

## 2018-04-22 DIAGNOSIS — R41841 Cognitive communication deficit: Secondary | ICD-10-CM | POA: Diagnosis not present

## 2018-04-22 DIAGNOSIS — R1311 Dysphagia, oral phase: Secondary | ICD-10-CM | POA: Diagnosis not present

## 2018-04-25 DIAGNOSIS — M6281 Muscle weakness (generalized): Secondary | ICD-10-CM | POA: Diagnosis not present

## 2018-04-25 DIAGNOSIS — R29898 Other symptoms and signs involving the musculoskeletal system: Secondary | ICD-10-CM | POA: Diagnosis not present

## 2018-04-25 DIAGNOSIS — Z9181 History of falling: Secondary | ICD-10-CM | POA: Diagnosis not present

## 2018-04-25 DIAGNOSIS — R1312 Dysphagia, oropharyngeal phase: Secondary | ICD-10-CM | POA: Diagnosis not present

## 2018-04-25 DIAGNOSIS — R41841 Cognitive communication deficit: Secondary | ICD-10-CM | POA: Diagnosis not present

## 2018-04-25 DIAGNOSIS — R1311 Dysphagia, oral phase: Secondary | ICD-10-CM | POA: Diagnosis not present

## 2018-04-26 DIAGNOSIS — M6281 Muscle weakness (generalized): Secondary | ICD-10-CM | POA: Diagnosis not present

## 2018-04-26 DIAGNOSIS — R1311 Dysphagia, oral phase: Secondary | ICD-10-CM | POA: Diagnosis not present

## 2018-04-26 DIAGNOSIS — R1312 Dysphagia, oropharyngeal phase: Secondary | ICD-10-CM | POA: Diagnosis not present

## 2018-04-26 DIAGNOSIS — Z9181 History of falling: Secondary | ICD-10-CM | POA: Diagnosis not present

## 2018-04-26 DIAGNOSIS — R29898 Other symptoms and signs involving the musculoskeletal system: Secondary | ICD-10-CM | POA: Diagnosis not present

## 2018-04-26 DIAGNOSIS — R41841 Cognitive communication deficit: Secondary | ICD-10-CM | POA: Diagnosis not present

## 2018-04-27 DIAGNOSIS — M6281 Muscle weakness (generalized): Secondary | ICD-10-CM | POA: Diagnosis not present

## 2018-04-27 DIAGNOSIS — Z9181 History of falling: Secondary | ICD-10-CM | POA: Diagnosis not present

## 2018-04-27 DIAGNOSIS — R29898 Other symptoms and signs involving the musculoskeletal system: Secondary | ICD-10-CM | POA: Diagnosis not present

## 2018-04-27 DIAGNOSIS — R41841 Cognitive communication deficit: Secondary | ICD-10-CM | POA: Diagnosis not present

## 2018-04-27 DIAGNOSIS — R1311 Dysphagia, oral phase: Secondary | ICD-10-CM | POA: Diagnosis not present

## 2018-04-27 DIAGNOSIS — R1312 Dysphagia, oropharyngeal phase: Secondary | ICD-10-CM | POA: Diagnosis not present

## 2018-04-28 DIAGNOSIS — R41841 Cognitive communication deficit: Secondary | ICD-10-CM | POA: Diagnosis not present

## 2018-04-28 DIAGNOSIS — R1311 Dysphagia, oral phase: Secondary | ICD-10-CM | POA: Diagnosis not present

## 2018-04-28 DIAGNOSIS — R1312 Dysphagia, oropharyngeal phase: Secondary | ICD-10-CM | POA: Diagnosis not present

## 2018-04-28 DIAGNOSIS — R29898 Other symptoms and signs involving the musculoskeletal system: Secondary | ICD-10-CM | POA: Diagnosis not present

## 2018-04-28 DIAGNOSIS — M6281 Muscle weakness (generalized): Secondary | ICD-10-CM | POA: Diagnosis not present

## 2018-04-28 DIAGNOSIS — Z9181 History of falling: Secondary | ICD-10-CM | POA: Diagnosis not present

## 2018-04-29 DIAGNOSIS — M6281 Muscle weakness (generalized): Secondary | ICD-10-CM | POA: Diagnosis not present

## 2018-04-29 DIAGNOSIS — Z9181 History of falling: Secondary | ICD-10-CM | POA: Diagnosis not present

## 2018-04-29 DIAGNOSIS — R41841 Cognitive communication deficit: Secondary | ICD-10-CM | POA: Diagnosis not present

## 2018-04-29 DIAGNOSIS — R1311 Dysphagia, oral phase: Secondary | ICD-10-CM | POA: Diagnosis not present

## 2018-04-29 DIAGNOSIS — R29898 Other symptoms and signs involving the musculoskeletal system: Secondary | ICD-10-CM | POA: Diagnosis not present

## 2018-04-29 DIAGNOSIS — R1312 Dysphagia, oropharyngeal phase: Secondary | ICD-10-CM | POA: Diagnosis not present

## 2018-05-18 DIAGNOSIS — F331 Major depressive disorder, recurrent, moderate: Secondary | ICD-10-CM | POA: Diagnosis not present

## 2018-05-18 DIAGNOSIS — F0390 Unspecified dementia without behavioral disturbance: Secondary | ICD-10-CM | POA: Diagnosis not present

## 2018-05-26 DIAGNOSIS — L603 Nail dystrophy: Secondary | ICD-10-CM | POA: Diagnosis not present

## 2018-05-26 DIAGNOSIS — B351 Tinea unguium: Secondary | ICD-10-CM | POA: Diagnosis not present

## 2018-05-26 DIAGNOSIS — I739 Peripheral vascular disease, unspecified: Secondary | ICD-10-CM | POA: Diagnosis not present

## 2018-05-26 DIAGNOSIS — Q845 Enlarged and hypertrophic nails: Secondary | ICD-10-CM | POA: Diagnosis not present

## 2018-06-15 DIAGNOSIS — F0390 Unspecified dementia without behavioral disturbance: Secondary | ICD-10-CM | POA: Diagnosis not present

## 2018-06-15 DIAGNOSIS — I10 Essential (primary) hypertension: Secondary | ICD-10-CM | POA: Diagnosis not present

## 2018-06-15 DIAGNOSIS — Z66 Do not resuscitate: Secondary | ICD-10-CM | POA: Diagnosis not present

## 2018-06-15 DIAGNOSIS — N183 Chronic kidney disease, stage 3 (moderate): Secondary | ICD-10-CM | POA: Diagnosis not present

## 2018-06-15 DIAGNOSIS — I4891 Unspecified atrial fibrillation: Secondary | ICD-10-CM | POA: Diagnosis not present

## 2018-06-15 DIAGNOSIS — J449 Chronic obstructive pulmonary disease, unspecified: Secondary | ICD-10-CM | POA: Diagnosis not present

## 2018-06-15 DIAGNOSIS — E039 Hypothyroidism, unspecified: Secondary | ICD-10-CM | POA: Diagnosis not present

## 2018-06-20 DIAGNOSIS — I509 Heart failure, unspecified: Secondary | ICD-10-CM | POA: Diagnosis not present

## 2018-06-20 DIAGNOSIS — I4891 Unspecified atrial fibrillation: Secondary | ICD-10-CM | POA: Diagnosis not present

## 2018-06-20 DIAGNOSIS — I10 Essential (primary) hypertension: Secondary | ICD-10-CM | POA: Diagnosis not present

## 2018-06-20 DIAGNOSIS — K1379 Other lesions of oral mucosa: Secondary | ICD-10-CM | POA: Diagnosis not present

## 2018-06-22 DIAGNOSIS — F0390 Unspecified dementia without behavioral disturbance: Secondary | ICD-10-CM | POA: Diagnosis not present

## 2018-06-22 DIAGNOSIS — F331 Major depressive disorder, recurrent, moderate: Secondary | ICD-10-CM | POA: Diagnosis not present

## 2018-07-20 DIAGNOSIS — F0390 Unspecified dementia without behavioral disturbance: Secondary | ICD-10-CM | POA: Diagnosis not present

## 2018-07-20 DIAGNOSIS — F331 Major depressive disorder, recurrent, moderate: Secondary | ICD-10-CM | POA: Diagnosis not present

## 2018-07-21 DIAGNOSIS — F331 Major depressive disorder, recurrent, moderate: Secondary | ICD-10-CM | POA: Diagnosis not present

## 2018-07-21 DIAGNOSIS — F0390 Unspecified dementia without behavioral disturbance: Secondary | ICD-10-CM | POA: Diagnosis not present

## 2018-07-29 DIAGNOSIS — L603 Nail dystrophy: Secondary | ICD-10-CM | POA: Diagnosis not present

## 2018-07-29 DIAGNOSIS — I739 Peripheral vascular disease, unspecified: Secondary | ICD-10-CM | POA: Diagnosis not present

## 2018-07-29 DIAGNOSIS — B351 Tinea unguium: Secondary | ICD-10-CM | POA: Diagnosis not present

## 2018-07-29 DIAGNOSIS — Q845 Enlarged and hypertrophic nails: Secondary | ICD-10-CM | POA: Diagnosis not present

## 2018-08-08 DIAGNOSIS — R05 Cough: Secondary | ICD-10-CM | POA: Diagnosis not present

## 2018-08-08 DIAGNOSIS — F0390 Unspecified dementia without behavioral disturbance: Secondary | ICD-10-CM | POA: Diagnosis not present

## 2018-08-08 DIAGNOSIS — I509 Heart failure, unspecified: Secondary | ICD-10-CM | POA: Diagnosis not present

## 2018-08-08 DIAGNOSIS — I10 Essential (primary) hypertension: Secondary | ICD-10-CM | POA: Diagnosis not present

## 2018-08-10 DIAGNOSIS — E039 Hypothyroidism, unspecified: Secondary | ICD-10-CM | POA: Diagnosis not present

## 2018-08-10 DIAGNOSIS — J449 Chronic obstructive pulmonary disease, unspecified: Secondary | ICD-10-CM | POA: Diagnosis not present

## 2018-08-10 DIAGNOSIS — I4891 Unspecified atrial fibrillation: Secondary | ICD-10-CM | POA: Diagnosis not present

## 2018-08-10 DIAGNOSIS — Z66 Do not resuscitate: Secondary | ICD-10-CM | POA: Diagnosis not present

## 2018-08-10 DIAGNOSIS — I10 Essential (primary) hypertension: Secondary | ICD-10-CM | POA: Diagnosis not present

## 2018-08-10 DIAGNOSIS — N183 Chronic kidney disease, stage 3 (moderate): Secondary | ICD-10-CM | POA: Diagnosis not present

## 2018-08-10 DIAGNOSIS — F0390 Unspecified dementia without behavioral disturbance: Secondary | ICD-10-CM | POA: Diagnosis not present

## 2018-08-12 DIAGNOSIS — R05 Cough: Secondary | ICD-10-CM | POA: Diagnosis not present

## 2018-08-12 DIAGNOSIS — R451 Restlessness and agitation: Secondary | ICD-10-CM | POA: Diagnosis not present

## 2018-08-12 DIAGNOSIS — F0390 Unspecified dementia without behavioral disturbance: Secondary | ICD-10-CM | POA: Diagnosis not present

## 2018-08-12 DIAGNOSIS — I4811 Longstanding persistent atrial fibrillation: Secondary | ICD-10-CM | POA: Diagnosis not present

## 2018-08-31 DIAGNOSIS — F0390 Unspecified dementia without behavioral disturbance: Secondary | ICD-10-CM | POA: Diagnosis not present

## 2018-08-31 DIAGNOSIS — F331 Major depressive disorder, recurrent, moderate: Secondary | ICD-10-CM | POA: Diagnosis not present

## 2018-09-30 DIAGNOSIS — I739 Peripheral vascular disease, unspecified: Secondary | ICD-10-CM | POA: Diagnosis not present

## 2018-09-30 DIAGNOSIS — Q845 Enlarged and hypertrophic nails: Secondary | ICD-10-CM | POA: Diagnosis not present

## 2018-09-30 DIAGNOSIS — B351 Tinea unguium: Secondary | ICD-10-CM | POA: Diagnosis not present

## 2018-09-30 DIAGNOSIS — L603 Nail dystrophy: Secondary | ICD-10-CM | POA: Diagnosis not present

## 2018-10-05 DIAGNOSIS — J449 Chronic obstructive pulmonary disease, unspecified: Secondary | ICD-10-CM | POA: Diagnosis not present

## 2018-10-05 DIAGNOSIS — F0390 Unspecified dementia without behavioral disturbance: Secondary | ICD-10-CM | POA: Diagnosis not present

## 2018-10-05 DIAGNOSIS — I10 Essential (primary) hypertension: Secondary | ICD-10-CM | POA: Diagnosis not present

## 2018-10-05 DIAGNOSIS — I509 Heart failure, unspecified: Secondary | ICD-10-CM | POA: Diagnosis not present

## 2018-10-05 DIAGNOSIS — I4891 Unspecified atrial fibrillation: Secondary | ICD-10-CM | POA: Diagnosis not present

## 2018-10-05 DIAGNOSIS — Z66 Do not resuscitate: Secondary | ICD-10-CM | POA: Diagnosis not present

## 2018-10-06 DIAGNOSIS — I10 Essential (primary) hypertension: Secondary | ICD-10-CM | POA: Diagnosis not present

## 2018-10-06 DIAGNOSIS — I4891 Unspecified atrial fibrillation: Secondary | ICD-10-CM | POA: Diagnosis not present

## 2018-10-06 DIAGNOSIS — B372 Candidiasis of skin and nail: Secondary | ICD-10-CM | POA: Diagnosis not present

## 2018-10-06 DIAGNOSIS — F0390 Unspecified dementia without behavioral disturbance: Secondary | ICD-10-CM | POA: Diagnosis not present

## 2018-10-12 DIAGNOSIS — F331 Major depressive disorder, recurrent, moderate: Secondary | ICD-10-CM | POA: Diagnosis not present

## 2018-10-12 DIAGNOSIS — F0390 Unspecified dementia without behavioral disturbance: Secondary | ICD-10-CM | POA: Diagnosis not present

## 2018-10-14 DIAGNOSIS — Z79899 Other long term (current) drug therapy: Secondary | ICD-10-CM | POA: Diagnosis not present

## 2018-10-14 DIAGNOSIS — E785 Hyperlipidemia, unspecified: Secondary | ICD-10-CM | POA: Diagnosis not present

## 2018-10-14 DIAGNOSIS — E559 Vitamin D deficiency, unspecified: Secondary | ICD-10-CM | POA: Diagnosis not present

## 2018-10-14 DIAGNOSIS — D649 Anemia, unspecified: Secondary | ICD-10-CM | POA: Diagnosis not present

## 2018-10-14 DIAGNOSIS — E119 Type 2 diabetes mellitus without complications: Secondary | ICD-10-CM | POA: Diagnosis not present

## 2018-10-17 DIAGNOSIS — I4891 Unspecified atrial fibrillation: Secondary | ICD-10-CM | POA: Diagnosis not present

## 2018-10-17 DIAGNOSIS — N183 Chronic kidney disease, stage 3 (moderate): Secondary | ICD-10-CM | POA: Diagnosis not present

## 2018-10-17 DIAGNOSIS — H1031 Unspecified acute conjunctivitis, right eye: Secondary | ICD-10-CM | POA: Diagnosis not present

## 2018-10-17 DIAGNOSIS — I10 Essential (primary) hypertension: Secondary | ICD-10-CM | POA: Diagnosis not present

## 2018-10-21 DIAGNOSIS — F331 Major depressive disorder, recurrent, moderate: Secondary | ICD-10-CM | POA: Diagnosis not present

## 2018-10-21 DIAGNOSIS — I739 Peripheral vascular disease, unspecified: Secondary | ICD-10-CM | POA: Diagnosis not present

## 2018-10-21 DIAGNOSIS — F0390 Unspecified dementia without behavioral disturbance: Secondary | ICD-10-CM | POA: Diagnosis not present

## 2018-10-21 DIAGNOSIS — F70 Mild intellectual disabilities: Secondary | ICD-10-CM | POA: Diagnosis not present

## 2018-10-27 DIAGNOSIS — H1031 Unspecified acute conjunctivitis, right eye: Secondary | ICD-10-CM | POA: Diagnosis not present

## 2018-10-27 DIAGNOSIS — I4891 Unspecified atrial fibrillation: Secondary | ICD-10-CM | POA: Diagnosis not present

## 2018-10-27 DIAGNOSIS — I10 Essential (primary) hypertension: Secondary | ICD-10-CM | POA: Diagnosis not present

## 2018-10-27 DIAGNOSIS — F0390 Unspecified dementia without behavioral disturbance: Secondary | ICD-10-CM | POA: Diagnosis not present

## 2018-11-09 DIAGNOSIS — E039 Hypothyroidism, unspecified: Secondary | ICD-10-CM | POA: Diagnosis not present

## 2018-11-09 DIAGNOSIS — Z9181 History of falling: Secondary | ICD-10-CM | POA: Diagnosis not present

## 2018-11-09 DIAGNOSIS — R29898 Other symptoms and signs involving the musculoskeletal system: Secondary | ICD-10-CM | POA: Diagnosis not present

## 2018-11-09 DIAGNOSIS — I4891 Unspecified atrial fibrillation: Secondary | ICD-10-CM | POA: Diagnosis not present

## 2018-11-09 DIAGNOSIS — I130 Hypertensive heart and chronic kidney disease with heart failure and stage 1 through stage 4 chronic kidney disease, or unspecified chronic kidney disease: Secondary | ICD-10-CM | POA: Diagnosis not present

## 2018-11-09 DIAGNOSIS — J449 Chronic obstructive pulmonary disease, unspecified: Secondary | ICD-10-CM | POA: Diagnosis not present

## 2018-11-09 DIAGNOSIS — F0390 Unspecified dementia without behavioral disturbance: Secondary | ICD-10-CM | POA: Diagnosis not present

## 2018-11-09 DIAGNOSIS — F331 Major depressive disorder, recurrent, moderate: Secondary | ICD-10-CM | POA: Diagnosis not present

## 2018-11-09 DIAGNOSIS — M6281 Muscle weakness (generalized): Secondary | ICD-10-CM | POA: Diagnosis not present

## 2018-11-09 DIAGNOSIS — R2681 Unsteadiness on feet: Secondary | ICD-10-CM | POA: Diagnosis not present

## 2018-11-09 DIAGNOSIS — R1311 Dysphagia, oral phase: Secondary | ICD-10-CM | POA: Diagnosis not present

## 2018-11-09 DIAGNOSIS — R41841 Cognitive communication deficit: Secondary | ICD-10-CM | POA: Diagnosis not present

## 2018-11-09 DIAGNOSIS — R1312 Dysphagia, oropharyngeal phase: Secondary | ICD-10-CM | POA: Diagnosis not present

## 2018-11-10 DIAGNOSIS — R1312 Dysphagia, oropharyngeal phase: Secondary | ICD-10-CM | POA: Diagnosis not present

## 2018-11-10 DIAGNOSIS — Z9181 History of falling: Secondary | ICD-10-CM | POA: Diagnosis not present

## 2018-11-10 DIAGNOSIS — R1311 Dysphagia, oral phase: Secondary | ICD-10-CM | POA: Diagnosis not present

## 2018-11-10 DIAGNOSIS — M6281 Muscle weakness (generalized): Secondary | ICD-10-CM | POA: Diagnosis not present

## 2018-11-10 DIAGNOSIS — R29898 Other symptoms and signs involving the musculoskeletal system: Secondary | ICD-10-CM | POA: Diagnosis not present

## 2018-11-10 DIAGNOSIS — R41841 Cognitive communication deficit: Secondary | ICD-10-CM | POA: Diagnosis not present

## 2018-11-11 DIAGNOSIS — R29898 Other symptoms and signs involving the musculoskeletal system: Secondary | ICD-10-CM | POA: Diagnosis not present

## 2018-11-11 DIAGNOSIS — R1311 Dysphagia, oral phase: Secondary | ICD-10-CM | POA: Diagnosis not present

## 2018-11-11 DIAGNOSIS — M6281 Muscle weakness (generalized): Secondary | ICD-10-CM | POA: Diagnosis not present

## 2018-11-11 DIAGNOSIS — Z9181 History of falling: Secondary | ICD-10-CM | POA: Diagnosis not present

## 2018-11-11 DIAGNOSIS — R41841 Cognitive communication deficit: Secondary | ICD-10-CM | POA: Diagnosis not present

## 2018-11-11 DIAGNOSIS — R1312 Dysphagia, oropharyngeal phase: Secondary | ICD-10-CM | POA: Diagnosis not present

## 2018-11-14 DIAGNOSIS — M6281 Muscle weakness (generalized): Secondary | ICD-10-CM | POA: Diagnosis not present

## 2018-11-14 DIAGNOSIS — R41841 Cognitive communication deficit: Secondary | ICD-10-CM | POA: Diagnosis not present

## 2018-11-14 DIAGNOSIS — R1311 Dysphagia, oral phase: Secondary | ICD-10-CM | POA: Diagnosis not present

## 2018-11-14 DIAGNOSIS — R29898 Other symptoms and signs involving the musculoskeletal system: Secondary | ICD-10-CM | POA: Diagnosis not present

## 2018-11-14 DIAGNOSIS — R1312 Dysphagia, oropharyngeal phase: Secondary | ICD-10-CM | POA: Diagnosis not present

## 2018-11-14 DIAGNOSIS — Z9181 History of falling: Secondary | ICD-10-CM | POA: Diagnosis not present

## 2018-11-15 DIAGNOSIS — R41841 Cognitive communication deficit: Secondary | ICD-10-CM | POA: Diagnosis not present

## 2018-11-15 DIAGNOSIS — R1311 Dysphagia, oral phase: Secondary | ICD-10-CM | POA: Diagnosis not present

## 2018-11-15 DIAGNOSIS — M6281 Muscle weakness (generalized): Secondary | ICD-10-CM | POA: Diagnosis not present

## 2018-11-15 DIAGNOSIS — R1312 Dysphagia, oropharyngeal phase: Secondary | ICD-10-CM | POA: Diagnosis not present

## 2018-11-15 DIAGNOSIS — R29898 Other symptoms and signs involving the musculoskeletal system: Secondary | ICD-10-CM | POA: Diagnosis not present

## 2018-11-15 DIAGNOSIS — Z9181 History of falling: Secondary | ICD-10-CM | POA: Diagnosis not present

## 2018-11-16 DIAGNOSIS — R29898 Other symptoms and signs involving the musculoskeletal system: Secondary | ICD-10-CM | POA: Diagnosis not present

## 2018-11-16 DIAGNOSIS — R1312 Dysphagia, oropharyngeal phase: Secondary | ICD-10-CM | POA: Diagnosis not present

## 2018-11-16 DIAGNOSIS — R1311 Dysphagia, oral phase: Secondary | ICD-10-CM | POA: Diagnosis not present

## 2018-11-16 DIAGNOSIS — M6281 Muscle weakness (generalized): Secondary | ICD-10-CM | POA: Diagnosis not present

## 2018-11-16 DIAGNOSIS — Z9181 History of falling: Secondary | ICD-10-CM | POA: Diagnosis not present

## 2018-11-16 DIAGNOSIS — R41841 Cognitive communication deficit: Secondary | ICD-10-CM | POA: Diagnosis not present

## 2018-11-18 DIAGNOSIS — R1312 Dysphagia, oropharyngeal phase: Secondary | ICD-10-CM | POA: Diagnosis not present

## 2018-11-18 DIAGNOSIS — M6281 Muscle weakness (generalized): Secondary | ICD-10-CM | POA: Diagnosis not present

## 2018-11-18 DIAGNOSIS — R1311 Dysphagia, oral phase: Secondary | ICD-10-CM | POA: Diagnosis not present

## 2018-11-18 DIAGNOSIS — R29898 Other symptoms and signs involving the musculoskeletal system: Secondary | ICD-10-CM | POA: Diagnosis not present

## 2018-11-18 DIAGNOSIS — Z9181 History of falling: Secondary | ICD-10-CM | POA: Diagnosis not present

## 2018-11-18 DIAGNOSIS — R41841 Cognitive communication deficit: Secondary | ICD-10-CM | POA: Diagnosis not present

## 2018-11-19 DIAGNOSIS — R1311 Dysphagia, oral phase: Secondary | ICD-10-CM | POA: Diagnosis not present

## 2018-11-19 DIAGNOSIS — R1312 Dysphagia, oropharyngeal phase: Secondary | ICD-10-CM | POA: Diagnosis not present

## 2018-11-19 DIAGNOSIS — M6281 Muscle weakness (generalized): Secondary | ICD-10-CM | POA: Diagnosis not present

## 2018-11-19 DIAGNOSIS — R29898 Other symptoms and signs involving the musculoskeletal system: Secondary | ICD-10-CM | POA: Diagnosis not present

## 2018-11-19 DIAGNOSIS — Z9181 History of falling: Secondary | ICD-10-CM | POA: Diagnosis not present

## 2018-11-19 DIAGNOSIS — R41841 Cognitive communication deficit: Secondary | ICD-10-CM | POA: Diagnosis not present

## 2018-11-21 DIAGNOSIS — R29898 Other symptoms and signs involving the musculoskeletal system: Secondary | ICD-10-CM | POA: Diagnosis not present

## 2018-11-21 DIAGNOSIS — F0390 Unspecified dementia without behavioral disturbance: Secondary | ICD-10-CM | POA: Diagnosis not present

## 2018-11-21 DIAGNOSIS — M6281 Muscle weakness (generalized): Secondary | ICD-10-CM | POA: Diagnosis not present

## 2018-11-21 DIAGNOSIS — R1311 Dysphagia, oral phase: Secondary | ICD-10-CM | POA: Diagnosis not present

## 2018-11-21 DIAGNOSIS — J449 Chronic obstructive pulmonary disease, unspecified: Secondary | ICD-10-CM | POA: Diagnosis not present

## 2018-11-21 DIAGNOSIS — I4891 Unspecified atrial fibrillation: Secondary | ICD-10-CM | POA: Diagnosis not present

## 2018-11-21 DIAGNOSIS — R41841 Cognitive communication deficit: Secondary | ICD-10-CM | POA: Diagnosis not present

## 2018-11-21 DIAGNOSIS — I130 Hypertensive heart and chronic kidney disease with heart failure and stage 1 through stage 4 chronic kidney disease, or unspecified chronic kidney disease: Secondary | ICD-10-CM | POA: Diagnosis not present

## 2018-11-21 DIAGNOSIS — E039 Hypothyroidism, unspecified: Secondary | ICD-10-CM | POA: Diagnosis not present

## 2018-11-21 DIAGNOSIS — R1312 Dysphagia, oropharyngeal phase: Secondary | ICD-10-CM | POA: Diagnosis not present

## 2018-11-21 DIAGNOSIS — Z9181 History of falling: Secondary | ICD-10-CM | POA: Diagnosis not present

## 2018-11-21 DIAGNOSIS — R2681 Unsteadiness on feet: Secondary | ICD-10-CM | POA: Diagnosis not present

## 2018-11-22 DIAGNOSIS — R1312 Dysphagia, oropharyngeal phase: Secondary | ICD-10-CM | POA: Diagnosis not present

## 2018-11-22 DIAGNOSIS — R1311 Dysphagia, oral phase: Secondary | ICD-10-CM | POA: Diagnosis not present

## 2018-11-22 DIAGNOSIS — R41841 Cognitive communication deficit: Secondary | ICD-10-CM | POA: Diagnosis not present

## 2018-11-22 DIAGNOSIS — M6281 Muscle weakness (generalized): Secondary | ICD-10-CM | POA: Diagnosis not present

## 2018-11-22 DIAGNOSIS — Z9181 History of falling: Secondary | ICD-10-CM | POA: Diagnosis not present

## 2018-11-22 DIAGNOSIS — R29898 Other symptoms and signs involving the musculoskeletal system: Secondary | ICD-10-CM | POA: Diagnosis not present

## 2018-11-24 DIAGNOSIS — M6281 Muscle weakness (generalized): Secondary | ICD-10-CM | POA: Diagnosis not present

## 2018-11-24 DIAGNOSIS — Z9181 History of falling: Secondary | ICD-10-CM | POA: Diagnosis not present

## 2018-11-24 DIAGNOSIS — R1311 Dysphagia, oral phase: Secondary | ICD-10-CM | POA: Diagnosis not present

## 2018-11-24 DIAGNOSIS — R1312 Dysphagia, oropharyngeal phase: Secondary | ICD-10-CM | POA: Diagnosis not present

## 2018-11-24 DIAGNOSIS — R41841 Cognitive communication deficit: Secondary | ICD-10-CM | POA: Diagnosis not present

## 2018-11-24 DIAGNOSIS — R29898 Other symptoms and signs involving the musculoskeletal system: Secondary | ICD-10-CM | POA: Diagnosis not present

## 2018-11-25 DIAGNOSIS — R1312 Dysphagia, oropharyngeal phase: Secondary | ICD-10-CM | POA: Diagnosis not present

## 2018-11-25 DIAGNOSIS — R1311 Dysphagia, oral phase: Secondary | ICD-10-CM | POA: Diagnosis not present

## 2018-11-25 DIAGNOSIS — R29898 Other symptoms and signs involving the musculoskeletal system: Secondary | ICD-10-CM | POA: Diagnosis not present

## 2018-11-25 DIAGNOSIS — Z9181 History of falling: Secondary | ICD-10-CM | POA: Diagnosis not present

## 2018-11-25 DIAGNOSIS — R41841 Cognitive communication deficit: Secondary | ICD-10-CM | POA: Diagnosis not present

## 2018-11-25 DIAGNOSIS — M6281 Muscle weakness (generalized): Secondary | ICD-10-CM | POA: Diagnosis not present

## 2018-11-26 DIAGNOSIS — M6281 Muscle weakness (generalized): Secondary | ICD-10-CM | POA: Diagnosis not present

## 2018-11-26 DIAGNOSIS — Z9181 History of falling: Secondary | ICD-10-CM | POA: Diagnosis not present

## 2018-11-26 DIAGNOSIS — R1311 Dysphagia, oral phase: Secondary | ICD-10-CM | POA: Diagnosis not present

## 2018-11-26 DIAGNOSIS — R29898 Other symptoms and signs involving the musculoskeletal system: Secondary | ICD-10-CM | POA: Diagnosis not present

## 2018-11-26 DIAGNOSIS — R41841 Cognitive communication deficit: Secondary | ICD-10-CM | POA: Diagnosis not present

## 2018-11-26 DIAGNOSIS — R1312 Dysphagia, oropharyngeal phase: Secondary | ICD-10-CM | POA: Diagnosis not present

## 2018-11-27 DIAGNOSIS — Z9181 History of falling: Secondary | ICD-10-CM | POA: Diagnosis not present

## 2018-11-27 DIAGNOSIS — R1311 Dysphagia, oral phase: Secondary | ICD-10-CM | POA: Diagnosis not present

## 2018-11-27 DIAGNOSIS — M6281 Muscle weakness (generalized): Secondary | ICD-10-CM | POA: Diagnosis not present

## 2018-11-27 DIAGNOSIS — R41841 Cognitive communication deficit: Secondary | ICD-10-CM | POA: Diagnosis not present

## 2018-11-27 DIAGNOSIS — R1312 Dysphagia, oropharyngeal phase: Secondary | ICD-10-CM | POA: Diagnosis not present

## 2018-11-27 DIAGNOSIS — R29898 Other symptoms and signs involving the musculoskeletal system: Secondary | ICD-10-CM | POA: Diagnosis not present

## 2018-11-29 DIAGNOSIS — R1311 Dysphagia, oral phase: Secondary | ICD-10-CM | POA: Diagnosis not present

## 2018-11-29 DIAGNOSIS — R1312 Dysphagia, oropharyngeal phase: Secondary | ICD-10-CM | POA: Diagnosis not present

## 2018-11-29 DIAGNOSIS — R29898 Other symptoms and signs involving the musculoskeletal system: Secondary | ICD-10-CM | POA: Diagnosis not present

## 2018-11-29 DIAGNOSIS — R41841 Cognitive communication deficit: Secondary | ICD-10-CM | POA: Diagnosis not present

## 2018-11-29 DIAGNOSIS — M6281 Muscle weakness (generalized): Secondary | ICD-10-CM | POA: Diagnosis not present

## 2018-11-29 DIAGNOSIS — Z9181 History of falling: Secondary | ICD-10-CM | POA: Diagnosis not present

## 2018-11-30 DIAGNOSIS — Z66 Do not resuscitate: Secondary | ICD-10-CM | POA: Diagnosis not present

## 2018-11-30 DIAGNOSIS — J449 Chronic obstructive pulmonary disease, unspecified: Secondary | ICD-10-CM | POA: Diagnosis not present

## 2018-11-30 DIAGNOSIS — Z9181 History of falling: Secondary | ICD-10-CM | POA: Diagnosis not present

## 2018-11-30 DIAGNOSIS — R1312 Dysphagia, oropharyngeal phase: Secondary | ICD-10-CM | POA: Diagnosis not present

## 2018-11-30 DIAGNOSIS — R41841 Cognitive communication deficit: Secondary | ICD-10-CM | POA: Diagnosis not present

## 2018-11-30 DIAGNOSIS — F0390 Unspecified dementia without behavioral disturbance: Secondary | ICD-10-CM | POA: Diagnosis not present

## 2018-11-30 DIAGNOSIS — I4891 Unspecified atrial fibrillation: Secondary | ICD-10-CM | POA: Diagnosis not present

## 2018-11-30 DIAGNOSIS — I10 Essential (primary) hypertension: Secondary | ICD-10-CM | POA: Diagnosis not present

## 2018-11-30 DIAGNOSIS — N183 Chronic kidney disease, stage 3 (moderate): Secondary | ICD-10-CM | POA: Diagnosis not present

## 2018-11-30 DIAGNOSIS — I509 Heart failure, unspecified: Secondary | ICD-10-CM | POA: Diagnosis not present

## 2018-11-30 DIAGNOSIS — M6281 Muscle weakness (generalized): Secondary | ICD-10-CM | POA: Diagnosis not present

## 2018-11-30 DIAGNOSIS — R29898 Other symptoms and signs involving the musculoskeletal system: Secondary | ICD-10-CM | POA: Diagnosis not present

## 2018-11-30 DIAGNOSIS — R1311 Dysphagia, oral phase: Secondary | ICD-10-CM | POA: Diagnosis not present

## 2018-12-01 DIAGNOSIS — R29898 Other symptoms and signs involving the musculoskeletal system: Secondary | ICD-10-CM | POA: Diagnosis not present

## 2018-12-01 DIAGNOSIS — R1311 Dysphagia, oral phase: Secondary | ICD-10-CM | POA: Diagnosis not present

## 2018-12-01 DIAGNOSIS — R1312 Dysphagia, oropharyngeal phase: Secondary | ICD-10-CM | POA: Diagnosis not present

## 2018-12-01 DIAGNOSIS — Z9181 History of falling: Secondary | ICD-10-CM | POA: Diagnosis not present

## 2018-12-01 DIAGNOSIS — R41841 Cognitive communication deficit: Secondary | ICD-10-CM | POA: Diagnosis not present

## 2018-12-01 DIAGNOSIS — M6281 Muscle weakness (generalized): Secondary | ICD-10-CM | POA: Diagnosis not present

## 2018-12-04 DIAGNOSIS — R1312 Dysphagia, oropharyngeal phase: Secondary | ICD-10-CM | POA: Diagnosis not present

## 2018-12-04 DIAGNOSIS — R41841 Cognitive communication deficit: Secondary | ICD-10-CM | POA: Diagnosis not present

## 2018-12-04 DIAGNOSIS — M6281 Muscle weakness (generalized): Secondary | ICD-10-CM | POA: Diagnosis not present

## 2018-12-04 DIAGNOSIS — Z9181 History of falling: Secondary | ICD-10-CM | POA: Diagnosis not present

## 2018-12-04 DIAGNOSIS — R1311 Dysphagia, oral phase: Secondary | ICD-10-CM | POA: Diagnosis not present

## 2018-12-04 DIAGNOSIS — R29898 Other symptoms and signs involving the musculoskeletal system: Secondary | ICD-10-CM | POA: Diagnosis not present

## 2018-12-05 DIAGNOSIS — R29898 Other symptoms and signs involving the musculoskeletal system: Secondary | ICD-10-CM | POA: Diagnosis not present

## 2018-12-05 DIAGNOSIS — R1312 Dysphagia, oropharyngeal phase: Secondary | ICD-10-CM | POA: Diagnosis not present

## 2018-12-05 DIAGNOSIS — M6281 Muscle weakness (generalized): Secondary | ICD-10-CM | POA: Diagnosis not present

## 2018-12-05 DIAGNOSIS — R41841 Cognitive communication deficit: Secondary | ICD-10-CM | POA: Diagnosis not present

## 2018-12-05 DIAGNOSIS — R1311 Dysphagia, oral phase: Secondary | ICD-10-CM | POA: Diagnosis not present

## 2018-12-05 DIAGNOSIS — Z9181 History of falling: Secondary | ICD-10-CM | POA: Diagnosis not present

## 2018-12-06 DIAGNOSIS — R29898 Other symptoms and signs involving the musculoskeletal system: Secondary | ICD-10-CM | POA: Diagnosis not present

## 2018-12-06 DIAGNOSIS — R41841 Cognitive communication deficit: Secondary | ICD-10-CM | POA: Diagnosis not present

## 2018-12-06 DIAGNOSIS — Z9181 History of falling: Secondary | ICD-10-CM | POA: Diagnosis not present

## 2018-12-06 DIAGNOSIS — R1311 Dysphagia, oral phase: Secondary | ICD-10-CM | POA: Diagnosis not present

## 2018-12-06 DIAGNOSIS — R1312 Dysphagia, oropharyngeal phase: Secondary | ICD-10-CM | POA: Diagnosis not present

## 2018-12-06 DIAGNOSIS — M6281 Muscle weakness (generalized): Secondary | ICD-10-CM | POA: Diagnosis not present

## 2018-12-07 DIAGNOSIS — F0390 Unspecified dementia without behavioral disturbance: Secondary | ICD-10-CM | POA: Diagnosis not present

## 2018-12-07 DIAGNOSIS — F331 Major depressive disorder, recurrent, moderate: Secondary | ICD-10-CM | POA: Diagnosis not present

## 2018-12-16 DIAGNOSIS — Z79899 Other long term (current) drug therapy: Secondary | ICD-10-CM | POA: Diagnosis not present

## 2018-12-16 DIAGNOSIS — D6489 Other specified anemias: Secondary | ICD-10-CM | POA: Diagnosis not present

## 2018-12-16 DIAGNOSIS — D649 Anemia, unspecified: Secondary | ICD-10-CM | POA: Diagnosis not present

## 2018-12-16 DIAGNOSIS — I509 Heart failure, unspecified: Secondary | ICD-10-CM | POA: Diagnosis not present

## 2018-12-16 DIAGNOSIS — E039 Hypothyroidism, unspecified: Secondary | ICD-10-CM | POA: Diagnosis not present

## 2018-12-16 DIAGNOSIS — E785 Hyperlipidemia, unspecified: Secondary | ICD-10-CM | POA: Diagnosis not present

## 2018-12-16 DIAGNOSIS — E559 Vitamin D deficiency, unspecified: Secondary | ICD-10-CM | POA: Diagnosis not present

## 2018-12-21 DIAGNOSIS — F0390 Unspecified dementia without behavioral disturbance: Secondary | ICD-10-CM | POA: Diagnosis not present

## 2018-12-21 DIAGNOSIS — F331 Major depressive disorder, recurrent, moderate: Secondary | ICD-10-CM | POA: Diagnosis not present

## 2019-01-06 DIAGNOSIS — I509 Heart failure, unspecified: Secondary | ICD-10-CM | POA: Diagnosis not present

## 2019-01-06 DIAGNOSIS — I4891 Unspecified atrial fibrillation: Secondary | ICD-10-CM | POA: Diagnosis not present

## 2019-01-06 DIAGNOSIS — R062 Wheezing: Secondary | ICD-10-CM | POA: Diagnosis not present

## 2019-01-06 DIAGNOSIS — I10 Essential (primary) hypertension: Secondary | ICD-10-CM | POA: Diagnosis not present

## 2019-01-20 DIAGNOSIS — R1311 Dysphagia, oral phase: Secondary | ICD-10-CM | POA: Diagnosis not present

## 2019-01-20 DIAGNOSIS — I130 Hypertensive heart and chronic kidney disease with heart failure and stage 1 through stage 4 chronic kidney disease, or unspecified chronic kidney disease: Secondary | ICD-10-CM | POA: Diagnosis not present

## 2019-01-20 DIAGNOSIS — E039 Hypothyroidism, unspecified: Secondary | ICD-10-CM | POA: Diagnosis not present

## 2019-01-20 DIAGNOSIS — R41841 Cognitive communication deficit: Secondary | ICD-10-CM | POA: Diagnosis not present

## 2019-01-20 DIAGNOSIS — R2681 Unsteadiness on feet: Secondary | ICD-10-CM | POA: Diagnosis not present

## 2019-01-20 DIAGNOSIS — M6281 Muscle weakness (generalized): Secondary | ICD-10-CM | POA: Diagnosis not present

## 2019-01-20 DIAGNOSIS — I4891 Unspecified atrial fibrillation: Secondary | ICD-10-CM | POA: Diagnosis not present

## 2019-01-20 DIAGNOSIS — R1312 Dysphagia, oropharyngeal phase: Secondary | ICD-10-CM | POA: Diagnosis not present

## 2019-01-20 DIAGNOSIS — R29898 Other symptoms and signs involving the musculoskeletal system: Secondary | ICD-10-CM | POA: Diagnosis not present

## 2019-01-20 DIAGNOSIS — F0390 Unspecified dementia without behavioral disturbance: Secondary | ICD-10-CM | POA: Diagnosis not present

## 2019-01-20 DIAGNOSIS — J449 Chronic obstructive pulmonary disease, unspecified: Secondary | ICD-10-CM | POA: Diagnosis not present

## 2019-01-20 DIAGNOSIS — Z9181 History of falling: Secondary | ICD-10-CM | POA: Diagnosis not present

## 2019-01-23 DIAGNOSIS — R41841 Cognitive communication deficit: Secondary | ICD-10-CM | POA: Diagnosis not present

## 2019-01-23 DIAGNOSIS — Z9181 History of falling: Secondary | ICD-10-CM | POA: Diagnosis not present

## 2019-01-23 DIAGNOSIS — R1312 Dysphagia, oropharyngeal phase: Secondary | ICD-10-CM | POA: Diagnosis not present

## 2019-01-23 DIAGNOSIS — R1311 Dysphagia, oral phase: Secondary | ICD-10-CM | POA: Diagnosis not present

## 2019-01-23 DIAGNOSIS — R29898 Other symptoms and signs involving the musculoskeletal system: Secondary | ICD-10-CM | POA: Diagnosis not present

## 2019-01-23 DIAGNOSIS — M6281 Muscle weakness (generalized): Secondary | ICD-10-CM | POA: Diagnosis not present

## 2019-01-24 DIAGNOSIS — R29898 Other symptoms and signs involving the musculoskeletal system: Secondary | ICD-10-CM | POA: Diagnosis not present

## 2019-01-24 DIAGNOSIS — R1312 Dysphagia, oropharyngeal phase: Secondary | ICD-10-CM | POA: Diagnosis not present

## 2019-01-24 DIAGNOSIS — Z9181 History of falling: Secondary | ICD-10-CM | POA: Diagnosis not present

## 2019-01-24 DIAGNOSIS — M6281 Muscle weakness (generalized): Secondary | ICD-10-CM | POA: Diagnosis not present

## 2019-01-24 DIAGNOSIS — R41841 Cognitive communication deficit: Secondary | ICD-10-CM | POA: Diagnosis not present

## 2019-01-24 DIAGNOSIS — R1311 Dysphagia, oral phase: Secondary | ICD-10-CM | POA: Diagnosis not present

## 2019-01-25 DIAGNOSIS — I10 Essential (primary) hypertension: Secondary | ICD-10-CM | POA: Diagnosis not present

## 2019-01-25 DIAGNOSIS — F331 Major depressive disorder, recurrent, moderate: Secondary | ICD-10-CM | POA: Diagnosis not present

## 2019-01-25 DIAGNOSIS — R1311 Dysphagia, oral phase: Secondary | ICD-10-CM | POA: Diagnosis not present

## 2019-01-25 DIAGNOSIS — Z66 Do not resuscitate: Secondary | ICD-10-CM | POA: Diagnosis not present

## 2019-01-25 DIAGNOSIS — N183 Chronic kidney disease, stage 3 (moderate): Secondary | ICD-10-CM | POA: Diagnosis not present

## 2019-01-25 DIAGNOSIS — M6281 Muscle weakness (generalized): Secondary | ICD-10-CM | POA: Diagnosis not present

## 2019-01-25 DIAGNOSIS — I4891 Unspecified atrial fibrillation: Secondary | ICD-10-CM | POA: Diagnosis not present

## 2019-01-25 DIAGNOSIS — R1312 Dysphagia, oropharyngeal phase: Secondary | ICD-10-CM | POA: Diagnosis not present

## 2019-01-25 DIAGNOSIS — R41841 Cognitive communication deficit: Secondary | ICD-10-CM | POA: Diagnosis not present

## 2019-01-25 DIAGNOSIS — F0390 Unspecified dementia without behavioral disturbance: Secondary | ICD-10-CM | POA: Diagnosis not present

## 2019-01-25 DIAGNOSIS — Z9181 History of falling: Secondary | ICD-10-CM | POA: Diagnosis not present

## 2019-01-25 DIAGNOSIS — J449 Chronic obstructive pulmonary disease, unspecified: Secondary | ICD-10-CM | POA: Diagnosis not present

## 2019-01-25 DIAGNOSIS — R29898 Other symptoms and signs involving the musculoskeletal system: Secondary | ICD-10-CM | POA: Diagnosis not present

## 2019-01-25 DIAGNOSIS — I509 Heart failure, unspecified: Secondary | ICD-10-CM | POA: Diagnosis not present

## 2019-01-26 DIAGNOSIS — R41841 Cognitive communication deficit: Secondary | ICD-10-CM | POA: Diagnosis not present

## 2019-01-26 DIAGNOSIS — R1311 Dysphagia, oral phase: Secondary | ICD-10-CM | POA: Diagnosis not present

## 2019-01-26 DIAGNOSIS — R29898 Other symptoms and signs involving the musculoskeletal system: Secondary | ICD-10-CM | POA: Diagnosis not present

## 2019-01-26 DIAGNOSIS — Z9181 History of falling: Secondary | ICD-10-CM | POA: Diagnosis not present

## 2019-01-26 DIAGNOSIS — M6281 Muscle weakness (generalized): Secondary | ICD-10-CM | POA: Diagnosis not present

## 2019-01-26 DIAGNOSIS — R1312 Dysphagia, oropharyngeal phase: Secondary | ICD-10-CM | POA: Diagnosis not present

## 2019-01-27 DIAGNOSIS — R29898 Other symptoms and signs involving the musculoskeletal system: Secondary | ICD-10-CM | POA: Diagnosis not present

## 2019-01-27 DIAGNOSIS — M6281 Muscle weakness (generalized): Secondary | ICD-10-CM | POA: Diagnosis not present

## 2019-01-27 DIAGNOSIS — R1311 Dysphagia, oral phase: Secondary | ICD-10-CM | POA: Diagnosis not present

## 2019-01-27 DIAGNOSIS — R41841 Cognitive communication deficit: Secondary | ICD-10-CM | POA: Diagnosis not present

## 2019-01-27 DIAGNOSIS — R1312 Dysphagia, oropharyngeal phase: Secondary | ICD-10-CM | POA: Diagnosis not present

## 2019-01-27 DIAGNOSIS — Z9181 History of falling: Secondary | ICD-10-CM | POA: Diagnosis not present

## 2019-01-30 DIAGNOSIS — R29898 Other symptoms and signs involving the musculoskeletal system: Secondary | ICD-10-CM | POA: Diagnosis not present

## 2019-01-30 DIAGNOSIS — M6281 Muscle weakness (generalized): Secondary | ICD-10-CM | POA: Diagnosis not present

## 2019-01-30 DIAGNOSIS — Z9181 History of falling: Secondary | ICD-10-CM | POA: Diagnosis not present

## 2019-01-30 DIAGNOSIS — R1312 Dysphagia, oropharyngeal phase: Secondary | ICD-10-CM | POA: Diagnosis not present

## 2019-01-30 DIAGNOSIS — R41841 Cognitive communication deficit: Secondary | ICD-10-CM | POA: Diagnosis not present

## 2019-01-30 DIAGNOSIS — R1311 Dysphagia, oral phase: Secondary | ICD-10-CM | POA: Diagnosis not present

## 2019-01-31 DIAGNOSIS — R1312 Dysphagia, oropharyngeal phase: Secondary | ICD-10-CM | POA: Diagnosis not present

## 2019-01-31 DIAGNOSIS — Z9181 History of falling: Secondary | ICD-10-CM | POA: Diagnosis not present

## 2019-01-31 DIAGNOSIS — R41841 Cognitive communication deficit: Secondary | ICD-10-CM | POA: Diagnosis not present

## 2019-01-31 DIAGNOSIS — R29898 Other symptoms and signs involving the musculoskeletal system: Secondary | ICD-10-CM | POA: Diagnosis not present

## 2019-01-31 DIAGNOSIS — M6281 Muscle weakness (generalized): Secondary | ICD-10-CM | POA: Diagnosis not present

## 2019-01-31 DIAGNOSIS — R1311 Dysphagia, oral phase: Secondary | ICD-10-CM | POA: Diagnosis not present

## 2019-02-01 DIAGNOSIS — R1311 Dysphagia, oral phase: Secondary | ICD-10-CM | POA: Diagnosis not present

## 2019-02-01 DIAGNOSIS — R1312 Dysphagia, oropharyngeal phase: Secondary | ICD-10-CM | POA: Diagnosis not present

## 2019-02-01 DIAGNOSIS — R29898 Other symptoms and signs involving the musculoskeletal system: Secondary | ICD-10-CM | POA: Diagnosis not present

## 2019-02-01 DIAGNOSIS — Z9181 History of falling: Secondary | ICD-10-CM | POA: Diagnosis not present

## 2019-02-01 DIAGNOSIS — M6281 Muscle weakness (generalized): Secondary | ICD-10-CM | POA: Diagnosis not present

## 2019-02-01 DIAGNOSIS — R41841 Cognitive communication deficit: Secondary | ICD-10-CM | POA: Diagnosis not present

## 2019-02-02 DIAGNOSIS — Z9181 History of falling: Secondary | ICD-10-CM | POA: Diagnosis not present

## 2019-02-02 DIAGNOSIS — R1312 Dysphagia, oropharyngeal phase: Secondary | ICD-10-CM | POA: Diagnosis not present

## 2019-02-02 DIAGNOSIS — R41841 Cognitive communication deficit: Secondary | ICD-10-CM | POA: Diagnosis not present

## 2019-02-02 DIAGNOSIS — R29898 Other symptoms and signs involving the musculoskeletal system: Secondary | ICD-10-CM | POA: Diagnosis not present

## 2019-02-02 DIAGNOSIS — M6281 Muscle weakness (generalized): Secondary | ICD-10-CM | POA: Diagnosis not present

## 2019-02-02 DIAGNOSIS — R1311 Dysphagia, oral phase: Secondary | ICD-10-CM | POA: Diagnosis not present

## 2019-02-03 DIAGNOSIS — R41841 Cognitive communication deficit: Secondary | ICD-10-CM | POA: Diagnosis not present

## 2019-02-03 DIAGNOSIS — R29898 Other symptoms and signs involving the musculoskeletal system: Secondary | ICD-10-CM | POA: Diagnosis not present

## 2019-02-03 DIAGNOSIS — Z9181 History of falling: Secondary | ICD-10-CM | POA: Diagnosis not present

## 2019-02-03 DIAGNOSIS — R1312 Dysphagia, oropharyngeal phase: Secondary | ICD-10-CM | POA: Diagnosis not present

## 2019-02-03 DIAGNOSIS — R1311 Dysphagia, oral phase: Secondary | ICD-10-CM | POA: Diagnosis not present

## 2019-02-03 DIAGNOSIS — M6281 Muscle weakness (generalized): Secondary | ICD-10-CM | POA: Diagnosis not present

## 2019-02-06 DIAGNOSIS — R1311 Dysphagia, oral phase: Secondary | ICD-10-CM | POA: Diagnosis not present

## 2019-02-06 DIAGNOSIS — R41841 Cognitive communication deficit: Secondary | ICD-10-CM | POA: Diagnosis not present

## 2019-02-06 DIAGNOSIS — R1312 Dysphagia, oropharyngeal phase: Secondary | ICD-10-CM | POA: Diagnosis not present

## 2019-02-06 DIAGNOSIS — R29898 Other symptoms and signs involving the musculoskeletal system: Secondary | ICD-10-CM | POA: Diagnosis not present

## 2019-02-06 DIAGNOSIS — Z9181 History of falling: Secondary | ICD-10-CM | POA: Diagnosis not present

## 2019-02-06 DIAGNOSIS — M6281 Muscle weakness (generalized): Secondary | ICD-10-CM | POA: Diagnosis not present

## 2019-02-07 DIAGNOSIS — R1312 Dysphagia, oropharyngeal phase: Secondary | ICD-10-CM | POA: Diagnosis not present

## 2019-02-07 DIAGNOSIS — R1311 Dysphagia, oral phase: Secondary | ICD-10-CM | POA: Diagnosis not present

## 2019-02-07 DIAGNOSIS — R41841 Cognitive communication deficit: Secondary | ICD-10-CM | POA: Diagnosis not present

## 2019-02-07 DIAGNOSIS — M6281 Muscle weakness (generalized): Secondary | ICD-10-CM | POA: Diagnosis not present

## 2019-02-07 DIAGNOSIS — R29898 Other symptoms and signs involving the musculoskeletal system: Secondary | ICD-10-CM | POA: Diagnosis not present

## 2019-02-07 DIAGNOSIS — Z9181 History of falling: Secondary | ICD-10-CM | POA: Diagnosis not present

## 2019-02-08 DIAGNOSIS — R41841 Cognitive communication deficit: Secondary | ICD-10-CM | POA: Diagnosis not present

## 2019-02-08 DIAGNOSIS — R1311 Dysphagia, oral phase: Secondary | ICD-10-CM | POA: Diagnosis not present

## 2019-02-08 DIAGNOSIS — F331 Major depressive disorder, recurrent, moderate: Secondary | ICD-10-CM | POA: Diagnosis not present

## 2019-02-08 DIAGNOSIS — R29898 Other symptoms and signs involving the musculoskeletal system: Secondary | ICD-10-CM | POA: Diagnosis not present

## 2019-02-08 DIAGNOSIS — F0390 Unspecified dementia without behavioral disturbance: Secondary | ICD-10-CM | POA: Diagnosis not present

## 2019-02-08 DIAGNOSIS — R1312 Dysphagia, oropharyngeal phase: Secondary | ICD-10-CM | POA: Diagnosis not present

## 2019-02-08 DIAGNOSIS — M6281 Muscle weakness (generalized): Secondary | ICD-10-CM | POA: Diagnosis not present

## 2019-02-08 DIAGNOSIS — Z9181 History of falling: Secondary | ICD-10-CM | POA: Diagnosis not present

## 2019-02-09 DIAGNOSIS — Z9181 History of falling: Secondary | ICD-10-CM | POA: Diagnosis not present

## 2019-02-09 DIAGNOSIS — M6281 Muscle weakness (generalized): Secondary | ICD-10-CM | POA: Diagnosis not present

## 2019-02-09 DIAGNOSIS — R41841 Cognitive communication deficit: Secondary | ICD-10-CM | POA: Diagnosis not present

## 2019-02-09 DIAGNOSIS — R1311 Dysphagia, oral phase: Secondary | ICD-10-CM | POA: Diagnosis not present

## 2019-02-09 DIAGNOSIS — R1312 Dysphagia, oropharyngeal phase: Secondary | ICD-10-CM | POA: Diagnosis not present

## 2019-02-09 DIAGNOSIS — R29898 Other symptoms and signs involving the musculoskeletal system: Secondary | ICD-10-CM | POA: Diagnosis not present

## 2019-02-10 DIAGNOSIS — R1312 Dysphagia, oropharyngeal phase: Secondary | ICD-10-CM | POA: Diagnosis not present

## 2019-02-10 DIAGNOSIS — E039 Hypothyroidism, unspecified: Secondary | ICD-10-CM | POA: Diagnosis not present

## 2019-02-10 DIAGNOSIS — I4891 Unspecified atrial fibrillation: Secondary | ICD-10-CM | POA: Diagnosis not present

## 2019-02-10 DIAGNOSIS — R1311 Dysphagia, oral phase: Secondary | ICD-10-CM | POA: Diagnosis not present

## 2019-02-10 DIAGNOSIS — R29898 Other symptoms and signs involving the musculoskeletal system: Secondary | ICD-10-CM | POA: Diagnosis not present

## 2019-02-10 DIAGNOSIS — R41841 Cognitive communication deficit: Secondary | ICD-10-CM | POA: Diagnosis not present

## 2019-02-10 DIAGNOSIS — I509 Heart failure, unspecified: Secondary | ICD-10-CM | POA: Diagnosis not present

## 2019-02-10 DIAGNOSIS — I10 Essential (primary) hypertension: Secondary | ICD-10-CM | POA: Diagnosis not present

## 2019-02-10 DIAGNOSIS — Z9181 History of falling: Secondary | ICD-10-CM | POA: Diagnosis not present

## 2019-02-10 DIAGNOSIS — M6281 Muscle weakness (generalized): Secondary | ICD-10-CM | POA: Diagnosis not present

## 2019-02-21 DIAGNOSIS — F0390 Unspecified dementia without behavioral disturbance: Secondary | ICD-10-CM | POA: Diagnosis not present

## 2019-02-21 DIAGNOSIS — F331 Major depressive disorder, recurrent, moderate: Secondary | ICD-10-CM | POA: Diagnosis not present

## 2019-03-22 DIAGNOSIS — I509 Heart failure, unspecified: Secondary | ICD-10-CM | POA: Diagnosis not present

## 2019-03-22 DIAGNOSIS — I10 Essential (primary) hypertension: Secondary | ICD-10-CM | POA: Diagnosis not present

## 2019-03-22 DIAGNOSIS — J449 Chronic obstructive pulmonary disease, unspecified: Secondary | ICD-10-CM | POA: Diagnosis not present

## 2019-03-22 DIAGNOSIS — F0390 Unspecified dementia without behavioral disturbance: Secondary | ICD-10-CM | POA: Diagnosis not present

## 2019-03-22 DIAGNOSIS — N183 Chronic kidney disease, stage 3 (moderate): Secondary | ICD-10-CM | POA: Diagnosis not present

## 2019-03-22 DIAGNOSIS — I4891 Unspecified atrial fibrillation: Secondary | ICD-10-CM | POA: Diagnosis not present

## 2019-03-22 DIAGNOSIS — Z20828 Contact with and (suspected) exposure to other viral communicable diseases: Secondary | ICD-10-CM | POA: Diagnosis not present

## 2019-03-22 DIAGNOSIS — Z66 Do not resuscitate: Secondary | ICD-10-CM | POA: Diagnosis not present

## 2019-03-29 DIAGNOSIS — Z20828 Contact with and (suspected) exposure to other viral communicable diseases: Secondary | ICD-10-CM | POA: Diagnosis not present

## 2019-04-05 DIAGNOSIS — Z20828 Contact with and (suspected) exposure to other viral communicable diseases: Secondary | ICD-10-CM | POA: Diagnosis not present

## 2019-04-06 DIAGNOSIS — F331 Major depressive disorder, recurrent, moderate: Secondary | ICD-10-CM | POA: Diagnosis not present

## 2019-04-06 DIAGNOSIS — G301 Alzheimer's disease with late onset: Secondary | ICD-10-CM | POA: Diagnosis not present

## 2019-04-12 DIAGNOSIS — Z20828 Contact with and (suspected) exposure to other viral communicable diseases: Secondary | ICD-10-CM | POA: Diagnosis not present

## 2019-04-21 DIAGNOSIS — I10 Essential (primary) hypertension: Secondary | ICD-10-CM | POA: Diagnosis not present

## 2019-04-21 DIAGNOSIS — S80812A Abrasion, left lower leg, initial encounter: Secondary | ICD-10-CM | POA: Diagnosis not present

## 2019-04-21 DIAGNOSIS — I4891 Unspecified atrial fibrillation: Secondary | ICD-10-CM | POA: Diagnosis not present

## 2019-04-21 DIAGNOSIS — N183 Chronic kidney disease, stage 3 (moderate): Secondary | ICD-10-CM | POA: Diagnosis not present

## 2019-05-02 DIAGNOSIS — S40021A Contusion of right upper arm, initial encounter: Secondary | ICD-10-CM | POA: Diagnosis not present

## 2019-05-02 DIAGNOSIS — I10 Essential (primary) hypertension: Secondary | ICD-10-CM | POA: Diagnosis not present

## 2019-05-02 DIAGNOSIS — S80812A Abrasion, left lower leg, initial encounter: Secondary | ICD-10-CM | POA: Diagnosis not present

## 2019-05-02 DIAGNOSIS — I4891 Unspecified atrial fibrillation: Secondary | ICD-10-CM | POA: Diagnosis not present

## 2019-05-16 DIAGNOSIS — G301 Alzheimer's disease with late onset: Secondary | ICD-10-CM | POA: Diagnosis not present

## 2019-05-16 DIAGNOSIS — F0280 Dementia in other diseases classified elsewhere without behavioral disturbance: Secondary | ICD-10-CM | POA: Diagnosis not present

## 2019-05-16 DIAGNOSIS — F3342 Major depressive disorder, recurrent, in full remission: Secondary | ICD-10-CM | POA: Diagnosis not present

## 2019-05-17 DIAGNOSIS — I4891 Unspecified atrial fibrillation: Secondary | ICD-10-CM | POA: Diagnosis not present

## 2019-05-17 DIAGNOSIS — Z66 Do not resuscitate: Secondary | ICD-10-CM | POA: Diagnosis not present

## 2019-05-17 DIAGNOSIS — I509 Heart failure, unspecified: Secondary | ICD-10-CM | POA: Diagnosis not present

## 2019-05-17 DIAGNOSIS — I10 Essential (primary) hypertension: Secondary | ICD-10-CM | POA: Diagnosis not present

## 2019-05-17 DIAGNOSIS — N183 Chronic kidney disease, stage 3 (moderate): Secondary | ICD-10-CM | POA: Diagnosis not present

## 2019-05-17 DIAGNOSIS — F0390 Unspecified dementia without behavioral disturbance: Secondary | ICD-10-CM | POA: Diagnosis not present

## 2019-05-17 DIAGNOSIS — J449 Chronic obstructive pulmonary disease, unspecified: Secondary | ICD-10-CM | POA: Diagnosis not present

## 2019-05-31 DIAGNOSIS — I739 Peripheral vascular disease, unspecified: Secondary | ICD-10-CM | POA: Diagnosis not present

## 2019-05-31 DIAGNOSIS — Q845 Enlarged and hypertrophic nails: Secondary | ICD-10-CM | POA: Diagnosis not present

## 2019-05-31 DIAGNOSIS — L603 Nail dystrophy: Secondary | ICD-10-CM | POA: Diagnosis not present

## 2019-05-31 DIAGNOSIS — B351 Tinea unguium: Secondary | ICD-10-CM | POA: Diagnosis not present

## 2019-06-01 DIAGNOSIS — D649 Anemia, unspecified: Secondary | ICD-10-CM | POA: Diagnosis not present

## 2019-06-01 DIAGNOSIS — I10 Essential (primary) hypertension: Secondary | ICD-10-CM | POA: Diagnosis not present

## 2019-06-01 DIAGNOSIS — Z79899 Other long term (current) drug therapy: Secondary | ICD-10-CM | POA: Diagnosis not present

## 2019-06-01 DIAGNOSIS — I509 Heart failure, unspecified: Secondary | ICD-10-CM | POA: Diagnosis not present

## 2019-06-13 DIAGNOSIS — F0280 Dementia in other diseases classified elsewhere without behavioral disturbance: Secondary | ICD-10-CM | POA: Diagnosis not present

## 2019-06-13 DIAGNOSIS — G301 Alzheimer's disease with late onset: Secondary | ICD-10-CM | POA: Diagnosis not present

## 2019-06-13 DIAGNOSIS — F3342 Major depressive disorder, recurrent, in full remission: Secondary | ICD-10-CM | POA: Diagnosis not present

## 2019-06-22 DIAGNOSIS — Z20828 Contact with and (suspected) exposure to other viral communicable diseases: Secondary | ICD-10-CM | POA: Diagnosis not present

## 2019-06-23 DIAGNOSIS — I509 Heart failure, unspecified: Secondary | ICD-10-CM | POA: Diagnosis not present

## 2019-06-23 DIAGNOSIS — I10 Essential (primary) hypertension: Secondary | ICD-10-CM | POA: Diagnosis not present

## 2019-06-23 DIAGNOSIS — F0390 Unspecified dementia without behavioral disturbance: Secondary | ICD-10-CM | POA: Diagnosis not present

## 2019-06-23 DIAGNOSIS — I4891 Unspecified atrial fibrillation: Secondary | ICD-10-CM | POA: Diagnosis not present

## 2019-06-28 DIAGNOSIS — Z20828 Contact with and (suspected) exposure to other viral communicable diseases: Secondary | ICD-10-CM | POA: Diagnosis not present

## 2019-07-05 DIAGNOSIS — N183 Chronic kidney disease, stage 3 unspecified: Secondary | ICD-10-CM | POA: Diagnosis not present

## 2019-07-05 DIAGNOSIS — I10 Essential (primary) hypertension: Secondary | ICD-10-CM | POA: Diagnosis not present

## 2019-07-05 DIAGNOSIS — Z66 Do not resuscitate: Secondary | ICD-10-CM | POA: Diagnosis not present

## 2019-07-05 DIAGNOSIS — F0390 Unspecified dementia without behavioral disturbance: Secondary | ICD-10-CM | POA: Diagnosis not present

## 2019-07-05 DIAGNOSIS — J449 Chronic obstructive pulmonary disease, unspecified: Secondary | ICD-10-CM | POA: Diagnosis not present

## 2019-07-05 DIAGNOSIS — I509 Heart failure, unspecified: Secondary | ICD-10-CM | POA: Diagnosis not present

## 2019-07-05 DIAGNOSIS — I4891 Unspecified atrial fibrillation: Secondary | ICD-10-CM | POA: Diagnosis not present

## 2019-07-11 DIAGNOSIS — Z20828 Contact with and (suspected) exposure to other viral communicable diseases: Secondary | ICD-10-CM | POA: Diagnosis not present

## 2019-07-18 DIAGNOSIS — Z20828 Contact with and (suspected) exposure to other viral communicable diseases: Secondary | ICD-10-CM | POA: Diagnosis not present

## 2019-07-24 DIAGNOSIS — J3489 Other specified disorders of nose and nasal sinuses: Secondary | ICD-10-CM | POA: Diagnosis not present

## 2019-07-24 DIAGNOSIS — I4891 Unspecified atrial fibrillation: Secondary | ICD-10-CM | POA: Diagnosis not present

## 2019-07-24 DIAGNOSIS — F0390 Unspecified dementia without behavioral disturbance: Secondary | ICD-10-CM | POA: Diagnosis not present

## 2019-07-24 DIAGNOSIS — I10 Essential (primary) hypertension: Secondary | ICD-10-CM | POA: Diagnosis not present

## 2019-08-01 DIAGNOSIS — Z20828 Contact with and (suspected) exposure to other viral communicable diseases: Secondary | ICD-10-CM | POA: Diagnosis not present

## 2019-08-08 DIAGNOSIS — Z20828 Contact with and (suspected) exposure to other viral communicable diseases: Secondary | ICD-10-CM | POA: Diagnosis not present

## 2019-08-15 DIAGNOSIS — Z20828 Contact with and (suspected) exposure to other viral communicable diseases: Secondary | ICD-10-CM | POA: Diagnosis not present

## 2019-08-22 DIAGNOSIS — Z20828 Contact with and (suspected) exposure to other viral communicable diseases: Secondary | ICD-10-CM | POA: Diagnosis not present

## 2019-09-04 DIAGNOSIS — I4891 Unspecified atrial fibrillation: Secondary | ICD-10-CM | POA: Diagnosis not present

## 2019-09-04 DIAGNOSIS — F329 Major depressive disorder, single episode, unspecified: Secondary | ICD-10-CM | POA: Diagnosis not present

## 2019-09-04 DIAGNOSIS — R4182 Altered mental status, unspecified: Secondary | ICD-10-CM | POA: Diagnosis not present

## 2019-09-04 DIAGNOSIS — F0390 Unspecified dementia without behavioral disturbance: Secondary | ICD-10-CM | POA: Diagnosis not present

## 2020-06-09 ENCOUNTER — Emergency Department (HOSPITAL_COMMUNITY)
Admission: EM | Admit: 2020-06-09 | Discharge: 2020-06-10 | Disposition: A | Discharge status: Home / Self Care | Payer: Medicare Other | Attending: Emergency Medicine | Admitting: Emergency Medicine

## 2020-06-09 ENCOUNTER — Other Ambulatory Visit: Payer: Self-pay

## 2020-06-09 ENCOUNTER — Emergency Department (HOSPITAL_COMMUNITY): Payer: Medicare Other

## 2020-06-09 ENCOUNTER — Encounter (HOSPITAL_COMMUNITY): Payer: Self-pay

## 2020-06-09 DIAGNOSIS — I509 Heart failure, unspecified: Secondary | ICD-10-CM | POA: Insufficient documentation

## 2020-06-09 DIAGNOSIS — S0990XA Unspecified injury of head, initial encounter: Secondary | ICD-10-CM | POA: Diagnosis present

## 2020-06-09 DIAGNOSIS — E039 Hypothyroidism, unspecified: Secondary | ICD-10-CM | POA: Diagnosis not present

## 2020-06-09 DIAGNOSIS — F0390 Unspecified dementia without behavioral disturbance: Secondary | ICD-10-CM | POA: Diagnosis not present

## 2020-06-09 DIAGNOSIS — S0101XA Laceration without foreign body of scalp, initial encounter: Secondary | ICD-10-CM | POA: Insufficient documentation

## 2020-06-09 DIAGNOSIS — N189 Chronic kidney disease, unspecified: Secondary | ICD-10-CM | POA: Diagnosis not present

## 2020-06-09 DIAGNOSIS — Z20822 Contact with and (suspected) exposure to covid-19: Secondary | ICD-10-CM | POA: Insufficient documentation

## 2020-06-09 DIAGNOSIS — I130 Hypertensive heart and chronic kidney disease with heart failure and stage 1 through stage 4 chronic kidney disease, or unspecified chronic kidney disease: Secondary | ICD-10-CM | POA: Diagnosis not present

## 2020-06-09 DIAGNOSIS — W19XXXA Unspecified fall, initial encounter: Secondary | ICD-10-CM | POA: Insufficient documentation

## 2020-06-09 DIAGNOSIS — N3000 Acute cystitis without hematuria: Secondary | ICD-10-CM | POA: Insufficient documentation

## 2020-06-09 LAB — BLOOD GAS, ARTERIAL
Acid-Base Excess: 0.7 mmol/L (ref 0.0–2.0)
Bicarbonate: 24.9 mmol/L (ref 20.0–28.0)
Drawn by: 308601
FIO2: 21
O2 Saturation: 95.7 %
Patient temperature: 98.6
pCO2 arterial: 40.7 mmHg (ref 32.0–48.0)
pH, Arterial: 7.404 (ref 7.350–7.450)
pO2, Arterial: 84.3 mmHg (ref 83.0–108.0)

## 2020-06-09 LAB — BLOOD GAS, VENOUS
Acid-base deficit: 1 mmol/L (ref 0.0–2.0)
Bicarbonate: 24.8 mmol/L (ref 20.0–28.0)
O2 Saturation: 68.5 %
Patient temperature: 98.6
pCO2, Ven: 48.8 mmHg (ref 44.0–60.0)
pH, Ven: 7.326 (ref 7.250–7.430)
pO2, Ven: 42 mmHg (ref 32.0–45.0)

## 2020-06-09 LAB — CBC
HCT: 36.3 % (ref 36.0–46.0)
Hemoglobin: 11.4 g/dL — ABNORMAL LOW (ref 12.0–15.0)
MCH: 30.3 pg (ref 26.0–34.0)
MCHC: 31.4 g/dL (ref 30.0–36.0)
MCV: 96.5 fL (ref 80.0–100.0)
Platelets: 182 10*3/uL (ref 150–400)
RBC: 3.76 MIL/uL — ABNORMAL LOW (ref 3.87–5.11)
RDW: 13.7 % (ref 11.5–15.5)
WBC: 11.2 10*3/uL — ABNORMAL HIGH (ref 4.0–10.5)
nRBC: 0 % (ref 0.0–0.2)

## 2020-06-09 LAB — TROPONIN I (HIGH SENSITIVITY)
Troponin I (High Sensitivity): 21 ng/L — ABNORMAL HIGH (ref <18)
Troponin I (High Sensitivity): 19 ng/L — ABNORMAL HIGH (ref <18)

## 2020-06-09 LAB — COMPREHENSIVE METABOLIC PANEL
ALT: 15 U/L (ref 0–44)
AST: 22 U/L (ref 15–41)
Albumin: 3.8 g/dL (ref 3.5–5.0)
Alkaline Phosphatase: 74 U/L (ref 38–126)
Anion gap: 10 (ref 5–15)
BUN: 41 mg/dL — ABNORMAL HIGH (ref 8–23)
CO2: 25 mmol/L (ref 22–32)
Calcium: 9.1 mg/dL (ref 8.9–10.3)
Chloride: 104 mmol/L (ref 98–111)
Creatinine, Ser: 1.26 mg/dL — ABNORMAL HIGH (ref 0.44–1.00)
GFR calc Af Amer: 43 mL/min — ABNORMAL LOW (ref >60)
GFR calc non Af Amer: 37 mL/min — ABNORMAL LOW (ref >60)
Glucose, Bld: 109 mg/dL — ABNORMAL HIGH (ref 70–99)
Potassium: 4.4 mmol/L (ref 3.5–5.1)
Sodium: 139 mmol/L (ref 135–145)
Total Bilirubin: 1 mg/dL (ref 0.3–1.2)
Total Protein: 7.7 g/dL (ref 6.5–8.1)

## 2020-06-09 LAB — URINALYSIS, ROUTINE W REFLEX MICROSCOPIC
Bilirubin Urine: NEGATIVE
Glucose, UA: NEGATIVE mg/dL
Ketones, ur: NEGATIVE mg/dL
Nitrite: POSITIVE — AB
Protein, ur: 30 mg/dL — AB
Specific Gravity, Urine: 1.015 (ref 1.005–1.030)
pH: 5 (ref 5.0–8.0)

## 2020-06-09 LAB — BRAIN NATRIURETIC PEPTIDE: B Natriuretic Peptide: 127.8 pg/mL — ABNORMAL HIGH (ref 0.0–100.0)

## 2020-06-09 MED ORDER — SODIUM CHLORIDE 0.9 % IV SOLN
1.0000 g | Freq: Once | INTRAVENOUS | Status: AC
  Administered 2020-06-09: 1 g via INTRAVENOUS
  Filled 2020-06-09: qty 10

## 2020-06-09 MED ORDER — CEPHALEXIN 500 MG PO CAPS: 500.0000 mg | ORAL_CAPSULE | Freq: Two times a day (BID) | ORAL | 0 refills | Status: AC

## 2020-06-09 NOTE — ED Provider Notes (Signed)
[facility]-EMERGENCY DEPT Provider Note   CSN: [id number] Arrival date & time: 06/09/20  1753     History Chief Complaint  Patient presents with  . Fall  . Head Injury    Christina [name] is a 86 y.o. female with pertinent past medical history of A. fib, CHF, CKD, severe dementia, hypertension, hypothyroidism that presents emergency department today for fall via EMS.  Patient is resident at [facility].  Patient states that she knows she fell today, is severely demented, unable to fully tell me story.  Is not complaining of pain anywhere.  Patient is level 5 caveat due to dementia.  Tried contacting [facility], was unsuccessful.  Did call daughter who states that patient fell backward in her wheelchair and hit her head.  Is not anticoagulated.  HPI     Past Medical History:  Diagnosis Date  . Atrial fibrillation (HCC)   . Atrial fibrillation (HCC)   . CHF (congestive heart failure) (HCC)   . Chronic low back pain   . CKD (chronic kidney disease) stage 3, GFR 30-59 ml/min   . Dysphagia   . Fall   . History of alcohol abuse   . History of narcotic addiction (HCC)   . Hypertension   . Hypothyroidism   . Muscle weakness   . SBO (small bowel obstruction) (HCC)   . Thyroid disease     Patient Active Problem List   Diagnosis Date Noted  . Chronic respiratory failure with hypoxia (HCC) 06/08/2017  . Acute on chronic kidney disease, stage 3 10/19/2011  . CHF (congestive heart failure) (HCC) 10/19/2011  . Shortness of breath 10/18/2011  . A-fib (HCC) 10/18/2011  . Essential hypertension 10/18/2011  . Hypothyroidism 10/18/2011    Past Surgical History:  Procedure Laterality Date  . ABDOMINAL HYSTERECTOMY    . BACK SURGERY    . ROTATOR CUFF REPAIR       OB History   No obstetric history on file.     Family History  Family history unknown: Yes    Social History   Tobacco Use  . Smoking status: Former Smoker    Packs/day: 1.00    Years:  38.00    Pack years: 38.00    Types: Cigarettes    Quit date: 09/21/1978    Years since quitting: 41.7  . Smokeless tobacco: Never Used  Vaping Use  . Vaping Use: Never used  Substance Use Topics  . Alcohol use: No    Comment: former etoh abuse  . Drug use: No    Home Medications Prior to Admission medications   Medication Sig Start Date End Date Taking? Authorizing Provider  acetaminophen (TYLENOL) 325 MG tablet Take 325 mg by mouth 3 (three) times daily.    Yes [provider]  Cholecalciferol (VITAMIN D3) 50 MCG (2000 UT) TABS Take 2,000 Units by mouth daily.   Yes [provider]  furosemide (LASIX) 40 MG tablet Take 40 mg by mouth daily.   Yes [provider]  guaiFENesin (MUCINEX) 600 MG 12 hr tablet Take 600 mg by mouth 2 (two) times daily.   Yes [provider]  hypromellose (SYSTANE OVERNIGHT THERAPY) 0.3 % GEL ophthalmic ointment Place 1 application into both eyes every 2 (two) hours as needed for dry eyes (irritation).   Yes [provider]  [employer] [facility] EX) Apply 1 application topically See admin instructions. Apply topically to buttocks post incontinent episode.   Yes [provider]  ipratropium-albuterol (DUONEB) 0.5-2.5 (  3) MG/3ML SOLN Take 3 mLs by nebulization every 6 (six) hours as needed. Patient taking differently: Take 3 mLs by nebulization every 6 (six) hours as needed (shortness of breath/wheezing).  06/07/17  Yes Nyoka Cowden, MD  levothyroxine (SYNTHROID, LEVOTHROID) 50 MCG tablet Take 50 mcg by mouth daily before breakfast.    Yes [provider]  losartan (COZAAR) 50 MG tablet Take 1 tablet (50 mg total) by mouth daily. 06/07/17  Yes Nyoka Cowden, MD  melatonin 3 MG TABS tablet Take 3 mg by mouth at bedtime as needed (insomnia).   Yes [provider]  Multiple Vitamins-Minerals (PRESERVISION AREDS 2) CAPS Take 1 capsule by mouth 2 (two) times daily.   Yes [provider]  OXYGEN Inhale 2 L into the lungs See admin instructions. Use overnight every day and during the day as needed to keep O2 SATS above 90%   Yes [provider]  Propylene Glycol (SYSTANE BALANCE) 0.6 % SOLN Place 1 drop into both eyes 2 (two) times daily.   Yes [provider]  cephALEXin (KEFLEX) 500 MG capsule Take 1 capsule (500 mg total) by mouth 2 (two) times daily for 7 days. 06/09/20 06/16/20  Farrel Gordon, PA-C    Allergies    Duragesic-100 [fentanyl], Lisinopril, Lyrica [pregabalin], Sudafed [pseudoephedrine hcl], and Macrodantin  Review of Systems   Review of Systems  Unable to perform ROS: Dementia    Physical Exam Updated Vital Signs BP 140/85 (BP Location: Left Arm)   Pulse 79   Resp 16   Ht  (1.626 m)   Wt 81.6 kg   SpO2 (!) 89%   BMI 30.90 kg/m   Physical Exam Constitutional:      General: She is not in acute distress.    Appearance: Normal appearance. She is not ill-appearing, toxic-appearing or diaphoretic.  HENT:     Head: Normocephalic.     Comments: Large hematoma in posterior scalp.  No bleeding, no lacerations.    Mouth/Throat:     Mouth: Mucous membranes are moist.     Pharynx: Oropharynx is clear.  Eyes:     General: No scleral icterus.    Extraocular Movements: Extraocular movements intact.     Pupils: Pupils are equal, round, and reactive to light.  Cardiovascular:     Rate and Rhythm: Normal rate and regular rhythm.     Pulses: Normal pulses.     Heart sounds: Normal heart sounds.  Pulmonary:     Effort: Pulmonary effort is normal. No respiratory distress.     Breath sounds: Normal breath sounds. No stridor. No wheezing, rhonchi or rales.  Chest:     Chest wall: No tenderness.  Abdominal:     General: Abdomen is flat. There is no distension.     Palpations: Abdomen is soft.     Tenderness: There is no abdominal tenderness. There is no guarding or rebound.  Musculoskeletal:        General: No swelling  or tenderness. Normal range of motion.     Cervical back: Normal range of motion and neck supple. No rigidity.     Right lower leg: No edema.     Left lower leg: No edema.     Comments: No tenderness to upper or lower extremities.  No step-offs or crepitus noted, no midline tenderness to cervical, thoracic or lumbar spine.    Skin:    General: Skin is warm and dry.     Capillary Refill: Capillary  refill takes less than 2 seconds.     Coloration: Skin is not pale.  Neurological:     General: No focal deficit present.     Mental Status: She is alert and oriented to person, place, and time. Mental status is at baseline.  Psychiatric:        Mood and Affect: Mood normal.        Behavior: Behavior normal.     ED Results / Procedures / Treatments   Labs (all labs ordered are listed, but only abnormal results are displayed) Labs Reviewed  CBC - Abnormal; Notable for the following components:      Result Value   WBC 11.2 (*)    RBC 3.76 (*)    Hemoglobin 11.4 (*)    All other components within normal limits  COMPREHENSIVE METABOLIC PANEL - Abnormal; Notable for the following components:   Glucose, Bld 109 (*)    BUN 41 (*)    Creatinine, Ser 1.26 (*)    GFR calc non Af Amer 37 (*)    GFR calc Af Amer 43 (*)    All other components within normal limits  URINALYSIS, ROUTINE W REFLEX MICROSCOPIC - Abnormal; Notable for the following components:   APPearance HAZY (*)    Hgb urine dipstick SMALL (*)    Protein, ur 30 (*)    Nitrite POSITIVE (*)    Leukocytes,Ua MODERATE (*)    Bacteria, UA MANY (*)    All other components within normal limits  BRAIN NATRIURETIC PEPTIDE - Abnormal; Notable for the following components:   B Natriuretic Peptide 127.8 (*)    All other components within normal limits  TROPONIN I (HIGH SENSITIVITY) - Abnormal; Notable for the following components:   Troponin I (High Sensitivity) 21 (*)    All other components within normal limits  TROPONIN I (HIGH  SENSITIVITY) - Abnormal; Notable for the following components:   Troponin I (High Sensitivity) 19 (*)    All other components within normal limits  URINE CULTURE  SARS CORONAVIRUS 2 BY RT PCR (HOSPITAL ORDER, PERFORMED IN [facility])  BLOOD GAS, VENOUS  BLOOD GAS, ARTERIAL    EKG EKG Interpretation  Date/Time:  Sunday June 09 2020 18:17:18 EDT Ventricular Rate:  82 PR Interval:    QRS Duration: 104 QT Interval:  391 QTC Calculation: 457 R Axis:   64 Text Interpretation: Atrial fibrillation Borderline repolarization abnormality No significant change since last tracing Confirmed by Richardean CanalYao, David H [phone]([id number]) on 06/09/2020 10:47:07 PM   Radiology DG Chest 1 View  Result Date: 06/09/2020 CLINICAL DATA:  Status post fall. EXAM: CHEST  1 VIEW COMPARISON:  June 07, 2017 FINDINGS: Mild, diffuse, chronic appearing increased lung markings are seen. There is no evidence of acute infiltrate, pleural effusion or pneumothorax. The cardiac silhouette is moderately enlarged. There is marked severity calcification of the aortic arch. Chronic seventh and eighth left rib fractures are seen. IMPRESSION: Chronic appearing increased lung markings without acute or active cardiopulmonary disease. Electronically Signed   By: Aram Candelahaddeus  Houston M.D.   On: 06/09/2020 19:45   DG Pelvis 1-2 Views  Result Date: 06/09/2020 CLINICAL DATA:  Recent fall with pelvic pain, initial encounter EXAM: PELVIS - 1-2 VIEW COMPARISON:  None. FINDINGS: Pelvic ring is intact. Degenerative changes of the sacroiliac joints are noted particularly on the left. Scoliosis and degenerative change in the lumbar spine is seen. No soft tissue abnormality is noted. IMPRESSION: No acute abnormality noted. Electronically Signed  By: Alcide Clever M.D.   On: 06/09/2020 19:45   CT Head Wo Contrast  Result Date: 06/09/2020 CLINICAL DATA:  Fall backward in wheelchair EXAM: CT HEAD WITHOUT CONTRAST TECHNIQUE: Contiguous axial  images were obtained from the base of the skull through the vertex without intravenous contrast. COMPARISON:  None. FINDINGS: Brain: No evidence of acute territorial infarction, hemorrhage, hydrocephalus,extra-axial collection or mass lesion/mass effect. There is dilatation the ventricles and sulci consistent with age-related atrophy. Low-attenuation changes in the deep white matter consistent with small vessel ischemia. Vascular: No hyperdense vessel or unexpected calcification. Skull: The skull is intact. No fracture or focal lesion identified. Sinuses/Orbits: The visualized paranasal sinuses and mastoid air cells are clear. The orbits and globes intact. Other: None Cervical spine: Alignment: There is straightening of the normal cervical lordosis. There is a minimal anterolisthesis of C3 on C4 measuring 2 mm. Skull base and vertebrae: Visualized skull base is intact. No atlanto-occipital dissociation. The vertebral body heights are well maintained. No fracture or pathologic osseous lesion seen. Soft tissues and spinal canal: The visualized paraspinal soft tissues are unremarkable. No prevertebral soft tissue swelling is seen. The spinal canal is grossly unremarkable, no large epidural collection or significant canal narrowing. Disc levels: Multilevel cervical spine spondylosis is noted with disc osteophyte complex and uncovertebral osteophytes most notable at C3-C4 and C4-C5 with severe neural foraminal narrowing and central canal stenosis. Upper chest: The lung apices are clear. Thoracic inlet is within normal limits. Other: None IMPRESSION: No acute intracranial abnormality. Findings consistent with age related atrophy and chronic small vessel ischemia No acute fracture or malalignment of the spine. Electronically Signed   By: Jonna Clark M.D.   On: 06/09/2020 20:44   CT Cervical Spine Wo Contrast  Result Date: 06/09/2020 CLINICAL DATA:  Fall backward in wheelchair EXAM: CT HEAD WITHOUT CONTRAST TECHNIQUE:  Contiguous axial images were obtained from the base of the skull through the vertex without intravenous contrast. COMPARISON:  None. FINDINGS: Brain: No evidence of acute territorial infarction, hemorrhage, hydrocephalus,extra-axial collection or mass lesion/mass effect. There is dilatation the ventricles and sulci consistent with age-related atrophy. Low-attenuation changes in the deep white matter consistent with small vessel ischemia. Vascular: No hyperdense vessel or unexpected calcification. Skull: The skull is intact. No fracture or focal lesion identified. Sinuses/Orbits: The visualized paranasal sinuses and mastoid air cells are clear. The orbits and globes intact. Other: None Cervical spine: Alignment: There is straightening of the normal cervical lordosis. There is a minimal anterolisthesis of C3 on C4 measuring 2 mm. Skull base and vertebrae: Visualized skull base is intact. No atlanto-occipital dissociation. The vertebral body heights are well maintained. No fracture or pathologic osseous lesion seen. Soft tissues and spinal canal: The visualized paraspinal soft tissues are unremarkable. No prevertebral soft tissue swelling is seen. The spinal canal is grossly unremarkable, no large epidural collection or significant canal narrowing. Disc levels: Multilevel cervical spine spondylosis is noted with disc osteophyte complex and uncovertebral osteophytes most notable at C3-C4 and C4-C5 with severe neural foraminal narrowing and central canal stenosis. Upper chest: The lung apices are clear. Thoracic inlet is within normal limits. Other: None IMPRESSION: No acute intracranial abnormality. Findings consistent with age related atrophy and chronic small vessel ischemia No acute fracture or malalignment of the spine. Electronically Signed   By: Jonna Clark M.D.   On: 06/09/2020 20:44    Procedures Procedures (including critical care time)  Medications Ordered in ED Medications  cefTRIAXone (ROCEPHIN) 1 g  in sodium  chloride 0.9 % 100 mL IVPB (1 g Intravenous New Bag/Given 06/09/20 2314)    ED Course  I have reviewed the triage vital signs and the nursing notes.  Pertinent labs & imaging results that were available during my care of the patient were reviewed by me and considered in my medical decision making (see chart for details).    MDM Rules/Calculators/A&P     CHA2DS2-VASc Score: 5                    Staci Mondesir is a 86 y.o. female with pertinent past medical history of A. fib, CHF, CKD, severe dementia, hypertension, hypothyroidism that presents emergency department today for fall via EMS.  Patient with large hematoma on scalp, no lacerations.  Patient is not complaining anything however severely demented.  Tried calling [facility] without answer, was able to speak to daughter.  Will obtain labs and CT scan at this time.  Patient slightly hypoxic to 89% on room air, will place on 2 L at this time.  Oxygen does appear to bounce around from 89% to 96% on room air.  I think this might be due to patient moving around, however will obtain Covid test, BMP, troponins at this time.  ABG normal with O2 sat in  96% therefore do not think patient is actually hypoxic.  EKG with patient in A. fib, no RVR.  Patient does have chronic A. fib.  Patient with CHA2DS2-VASc score 5, patient is not anticoagulated because she has high fall risk.  Will not initiate anticoagulation at this time due to fall risk.  Imaging of CT head and neck and plain films negative.  Labs remarkable for first troponin is 21, repeat 19.  BNP 127.8.  Patient does not appear to be fluid overloaded.  Chest x-ray without any acute cardiopulmonary disease.  Urinalysis does show UTI, will start IV antibiotics in the ER and transition to outpatient p.o. antibiotics.  Doubt need for further emergent work up at this time. I explained the diagnosis and have given explicit precautions to return to the ER including for any other new or worsening  symptoms. The patient understands and accepts the medical plan as it's been dictated and I have answered their questions. Discharge instructions concerning home care and prescriptions have been given. The patient is STABLE and is discharged to home in good condition.   I discussed this case with my attending physician, Dr. Silverio Lay who cosigned this note including patient's presenting symptoms, physical exam, and planned diagnostics and interventions. Attending physician stated agreement with plan or made changes to plan which were implemented.   Attending physician assessed patient at bedside.  Final Clinical Impression(s) / ED Diagnoses Final diagnoses:  Fall, initial encounter  Acute cystitis without hematuria    Rx / DC Orders ED Discharge Orders         Ordered    cephALEXin (KEFLEX) 500 MG capsule  2 times daily        06/09/20 2356           Farrel Gordon, PA-C 06/10/20 0002    Charlynne Pander, MD 06/13/20 [phone]

## 2020-06-09 NOTE — ED Notes (Signed)
Richarda Osmond, daughter, [phone], wants update on mom, she said she is POA but can't come to hospital because her husband is having surgery Tuesday and she is isolating.

## 2020-06-09 NOTE — ED Triage Notes (Signed)
Per EMS- Patient is from [facility]. Patient has a DNR and a pink MOST form. EMs /STaff at [facility] was unable to contact the patient's daughter regarding transport.   Staff reported that the patient fell backwards in her wheelchair and hit the back of her head on [address]. Patient has a hematoma. Staff states that the patient is at her baseline. Patient has dementia.

## 2020-06-09 NOTE — Discharge Instructions (Addendum)
You are seen today for fall, your work-up was reassuring.  You do have a UTI.  Please take your antibiotics as prescribed, talk to your pharmacist about any interactions or side effects from this medication.  Please come back to the emergency department for any new or worsening concerning symptoms.  Your Covid test is pending, you guys will be able to see this on my chart.  Follow-up with your primary care in the next couple of days.

## 2020-06-10 LAB — SARS CORONAVIRUS 2 BY RT PCR (HOSPITAL ORDER, PERFORMED IN ~~LOC~~ HOSPITAL LAB): SARS Coronavirus 2: NEGATIVE

## 2020-06-10 NOTE — ED Notes (Signed)
[facility] here to get pt. Report given

## 2020-06-10 NOTE — ED Notes (Signed)
Transport called for pt to go to [facility]

## 2020-06-11 LAB — URINE CULTURE

## 2020-08-27 DIAGNOSIS — Z20822 Contact with and (suspected) exposure to covid-19: Secondary | ICD-10-CM | POA: Diagnosis not present

## 2020-08-29 DIAGNOSIS — Z20822 Contact with and (suspected) exposure to covid-19: Secondary | ICD-10-CM | POA: Diagnosis not present

## 2020-09-21 DIAGNOSIS — E039 Hypothyroidism, unspecified: Secondary | ICD-10-CM | POA: Diagnosis not present

## 2020-09-21 DIAGNOSIS — K219 Gastro-esophageal reflux disease without esophagitis: Secondary | ICD-10-CM | POA: Diagnosis not present

## 2020-09-21 DIAGNOSIS — R2681 Unsteadiness on feet: Secondary | ICD-10-CM | POA: Diagnosis not present

## 2020-09-21 DIAGNOSIS — I4891 Unspecified atrial fibrillation: Secondary | ICD-10-CM | POA: Diagnosis not present

## 2020-09-21 DIAGNOSIS — F0390 Unspecified dementia without behavioral disturbance: Secondary | ICD-10-CM | POA: Diagnosis not present

## 2020-09-21 DIAGNOSIS — I130 Hypertensive heart and chronic kidney disease with heart failure and stage 1 through stage 4 chronic kidney disease, or unspecified chronic kidney disease: Secondary | ICD-10-CM | POA: Diagnosis not present

## 2020-09-21 DIAGNOSIS — J449 Chronic obstructive pulmonary disease, unspecified: Secondary | ICD-10-CM | POA: Diagnosis not present

## 2020-09-21 DIAGNOSIS — R2689 Other abnormalities of gait and mobility: Secondary | ICD-10-CM | POA: Diagnosis not present

## 2020-09-21 DIAGNOSIS — M6281 Muscle weakness (generalized): Secondary | ICD-10-CM | POA: Diagnosis not present

## 2020-10-22 DIAGNOSIS — E860 Dehydration: Secondary | ICD-10-CM | POA: Diagnosis not present

## 2020-10-22 DIAGNOSIS — I10 Essential (primary) hypertension: Secondary | ICD-10-CM | POA: Diagnosis not present

## 2020-10-22 DIAGNOSIS — F329 Major depressive disorder, single episode, unspecified: Secondary | ICD-10-CM | POA: Diagnosis not present

## 2020-10-22 DIAGNOSIS — I509 Heart failure, unspecified: Secondary | ICD-10-CM | POA: Diagnosis not present

## 2020-10-22 DIAGNOSIS — R4182 Altered mental status, unspecified: Secondary | ICD-10-CM | POA: Diagnosis not present

## 2020-10-22 DIAGNOSIS — F0390 Unspecified dementia without behavioral disturbance: Secondary | ICD-10-CM | POA: Diagnosis not present

## 2020-10-23 DIAGNOSIS — D649 Anemia, unspecified: Secondary | ICD-10-CM | POA: Diagnosis not present

## 2020-10-23 DIAGNOSIS — R4182 Altered mental status, unspecified: Secondary | ICD-10-CM | POA: Diagnosis not present

## 2020-10-23 DIAGNOSIS — I10 Essential (primary) hypertension: Secondary | ICD-10-CM | POA: Diagnosis not present

## 2020-10-24 DIAGNOSIS — E860 Dehydration: Secondary | ICD-10-CM | POA: Diagnosis not present

## 2020-10-24 DIAGNOSIS — F0390 Unspecified dementia without behavioral disturbance: Secondary | ICD-10-CM | POA: Diagnosis not present

## 2020-10-24 DIAGNOSIS — R4182 Altered mental status, unspecified: Secondary | ICD-10-CM | POA: Diagnosis not present

## 2020-10-24 DIAGNOSIS — R451 Restlessness and agitation: Secondary | ICD-10-CM | POA: Diagnosis not present

## 2020-10-24 DIAGNOSIS — I509 Heart failure, unspecified: Secondary | ICD-10-CM | POA: Diagnosis not present

## 2020-10-24 DIAGNOSIS — F329 Major depressive disorder, single episode, unspecified: Secondary | ICD-10-CM | POA: Diagnosis not present

## 2020-10-24 DIAGNOSIS — I10 Essential (primary) hypertension: Secondary | ICD-10-CM | POA: Diagnosis not present

## 2020-10-29 DIAGNOSIS — F0280 Dementia in other diseases classified elsewhere without behavioral disturbance: Secondary | ICD-10-CM | POA: Diagnosis not present

## 2020-10-29 DIAGNOSIS — G309 Alzheimer's disease, unspecified: Secondary | ICD-10-CM | POA: Diagnosis not present

## 2020-10-29 DIAGNOSIS — I130 Hypertensive heart and chronic kidney disease with heart failure and stage 1 through stage 4 chronic kidney disease, or unspecified chronic kidney disease: Secondary | ICD-10-CM | POA: Diagnosis not present

## 2020-10-29 DIAGNOSIS — I4891 Unspecified atrial fibrillation: Secondary | ICD-10-CM | POA: Diagnosis not present

## 2020-10-29 DIAGNOSIS — R1310 Dysphagia, unspecified: Secondary | ICD-10-CM | POA: Diagnosis not present

## 2020-10-29 DIAGNOSIS — E039 Hypothyroidism, unspecified: Secondary | ICD-10-CM | POA: Diagnosis not present

## 2020-10-29 DIAGNOSIS — I509 Heart failure, unspecified: Secondary | ICD-10-CM | POA: Diagnosis not present

## 2020-10-29 DIAGNOSIS — I739 Peripheral vascular disease, unspecified: Secondary | ICD-10-CM | POA: Diagnosis not present

## 2020-10-29 DIAGNOSIS — Z8616 Personal history of COVID-19: Secondary | ICD-10-CM | POA: Diagnosis not present

## 2020-10-29 DIAGNOSIS — Z87440 Personal history of urinary (tract) infections: Secondary | ICD-10-CM | POA: Diagnosis not present

## 2020-10-29 DIAGNOSIS — Z8719 Personal history of other diseases of the digestive system: Secondary | ICD-10-CM | POA: Diagnosis not present

## 2020-10-29 DIAGNOSIS — N183 Chronic kidney disease, stage 3 unspecified: Secondary | ICD-10-CM | POA: Diagnosis not present

## 2020-10-29 DIAGNOSIS — Z6832 Body mass index (BMI) 32.0-32.9, adult: Secondary | ICD-10-CM | POA: Diagnosis not present

## 2020-10-29 DIAGNOSIS — H04123 Dry eye syndrome of bilateral lacrimal glands: Secondary | ICD-10-CM | POA: Diagnosis not present

## 2020-10-29 DIAGNOSIS — K219 Gastro-esophageal reflux disease without esophagitis: Secondary | ICD-10-CM | POA: Diagnosis not present

## 2022-05-03 IMAGING — CT CT HEAD W/O CM
4 series · 15 of 47 positions shown, 17 images · non-contrast
Comparison: None.

CLINICAL DATA: Fall backward in wheelchair

EXAM:
CT HEAD WITHOUT CONTRAST
TECHNIQUE: Contiguous axial images were obtained from the base of the skull
through the vertex without intravenous contrast.

[Series 3: head wo · axial · 0.43mm/px · z∈[+1222,+1332]mm · 7 of 30 slices shown, 9 images]
[im 4/30  brain]
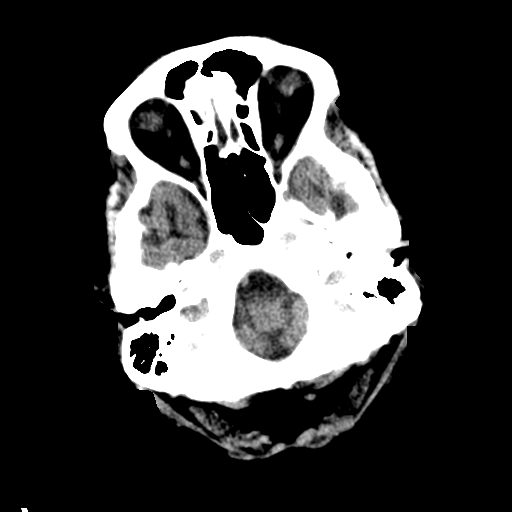
[im 4/30  bone]
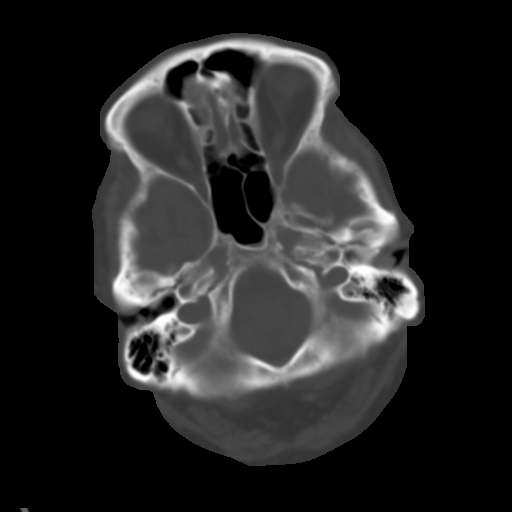
[im 8/30  brain]
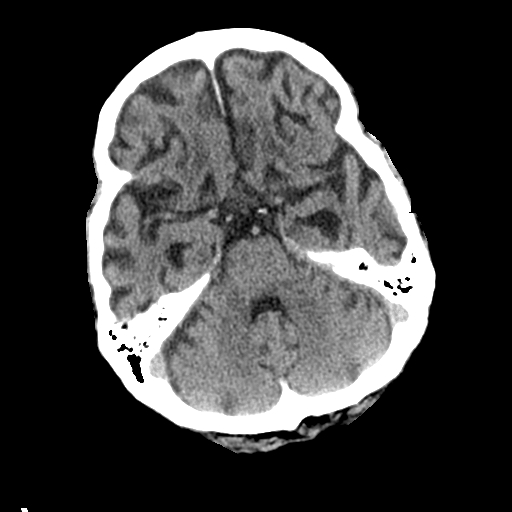
[im 11/30  brain]
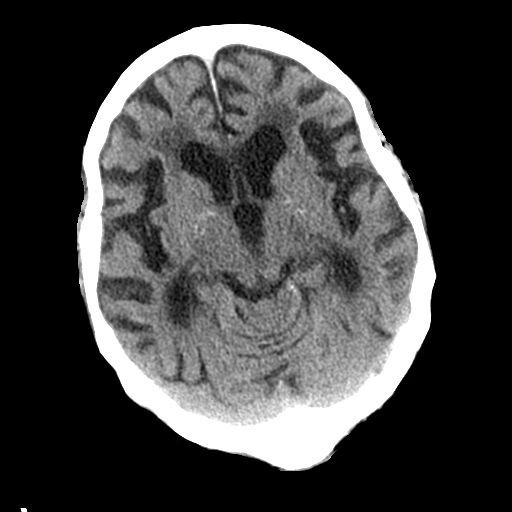
[im 15/30  brain]
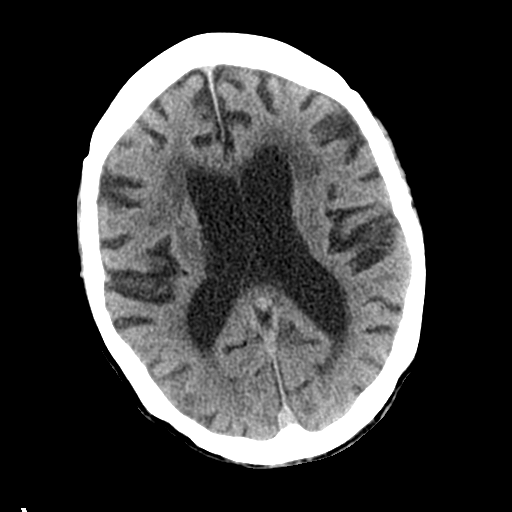
[im 19/30  brain]
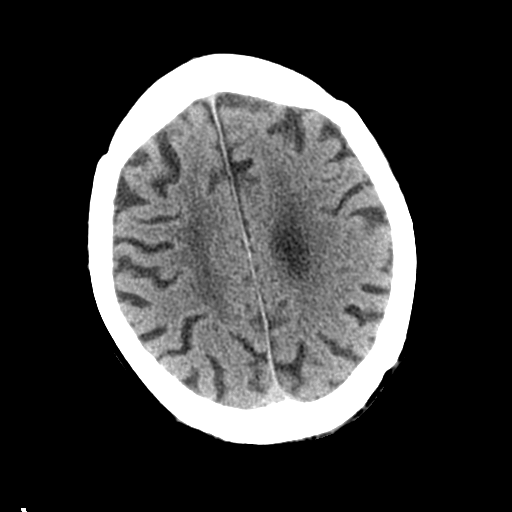
[im 19/30  bone]
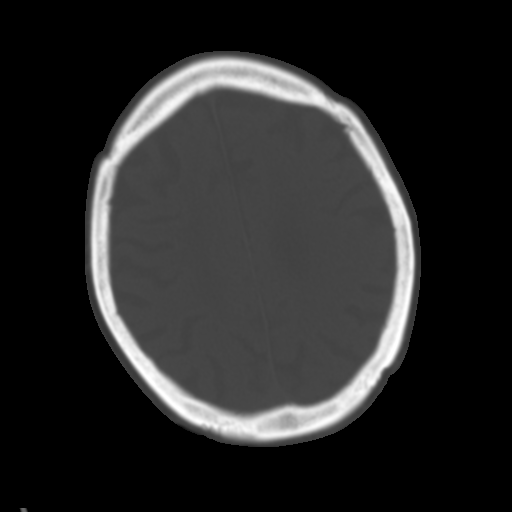
[im 22/30  brain]
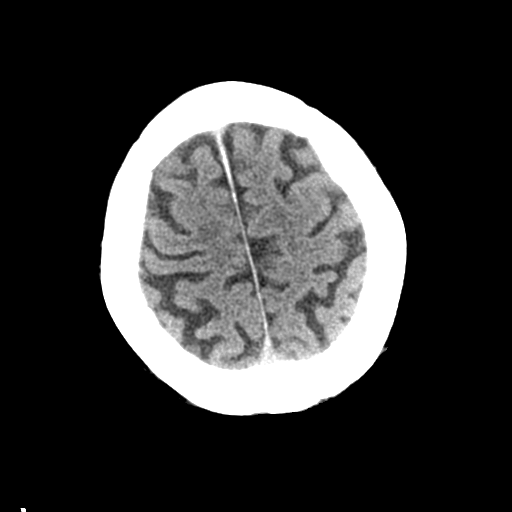
[im 26/30  brain]
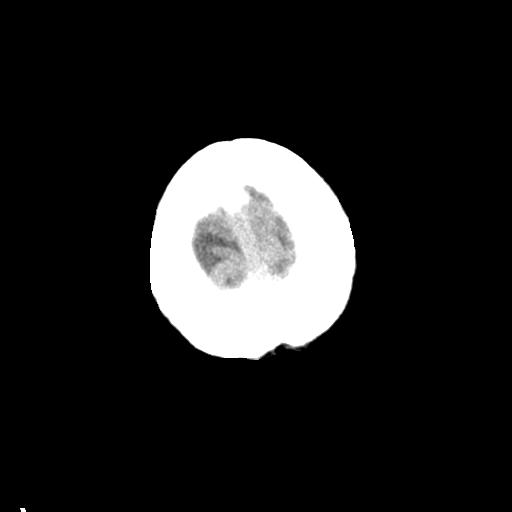

[Series 4: head bone · axial · 0.43mm/px · z∈[+1222,+1236]mm · 2 of 75 slices shown]
[im 8/75  bone]
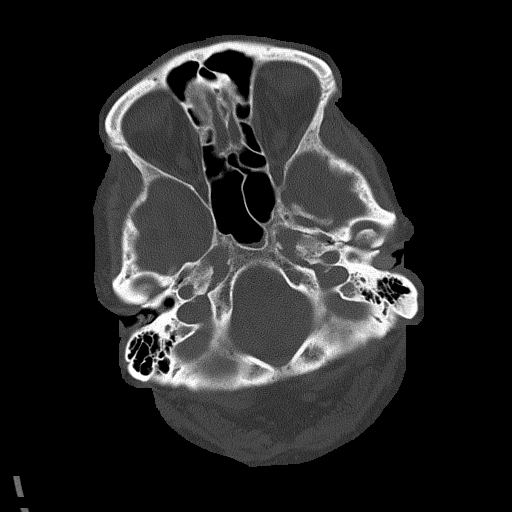
[im 15/75  bone]
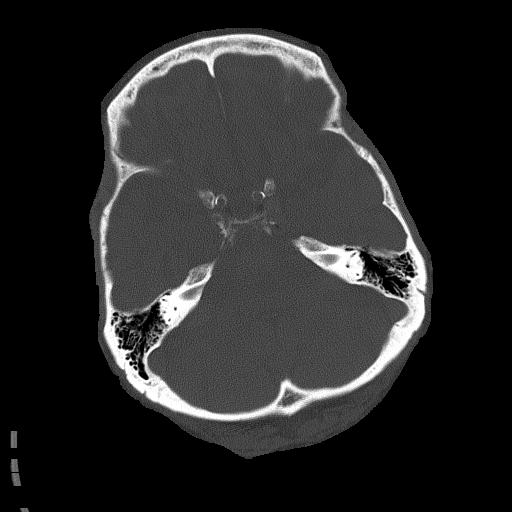

[Series 6: coronal soft tissue · coronal · 0.33mm/px · 3 of 79 slices shown]
[im 27/79  brain]
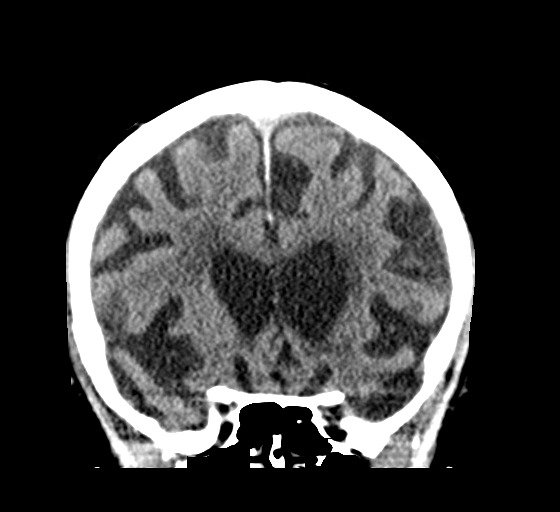
[im 35/79  brain]
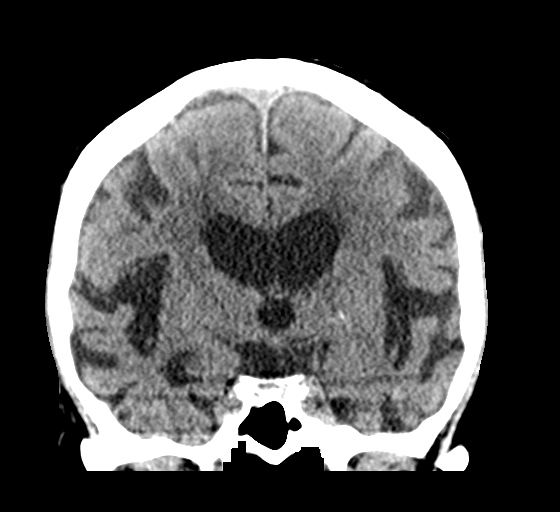
[im 44/79  brain]
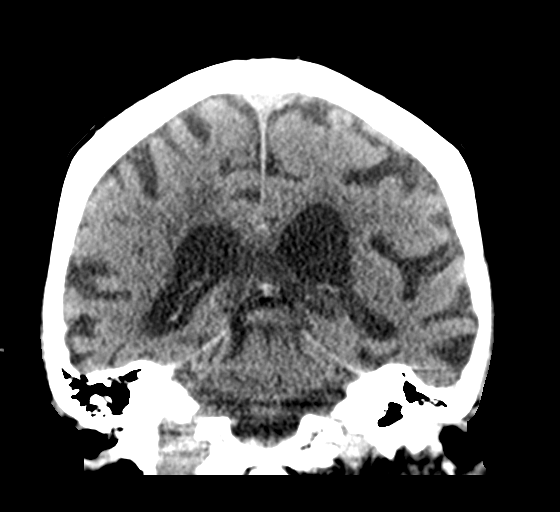

[Series 7: sagittal soft tissue · sagittal · 0.33mm/px · 3 of 63 slices shown]
[im 21/63  brain]
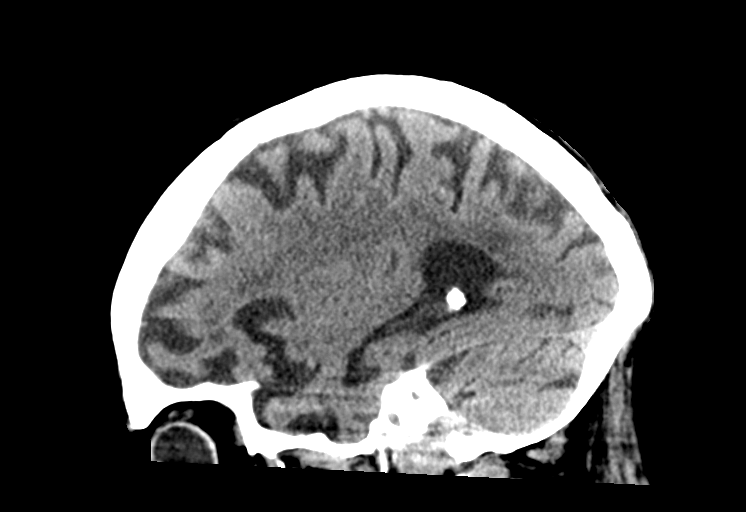
[im 32/63  brain]
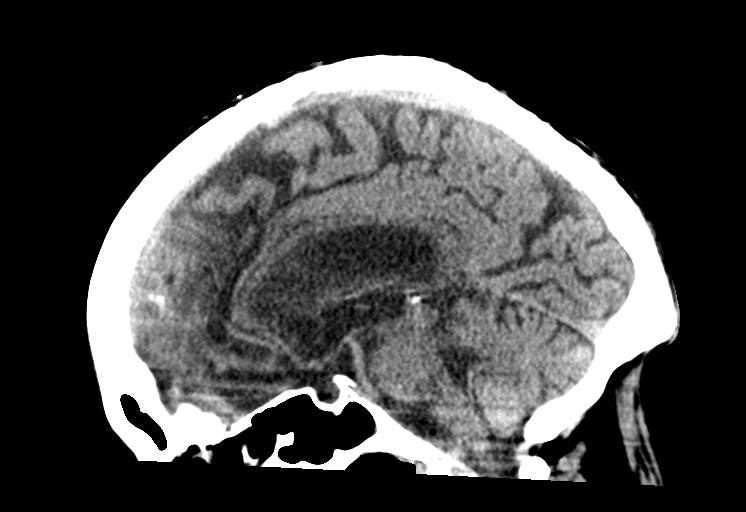
[im 42/63  brain]
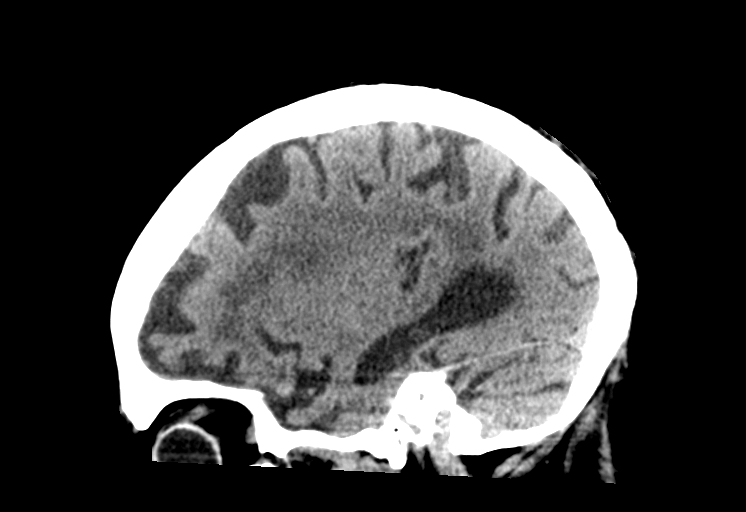

[15 of 47 positions shown; findings below may reference images not displayed]

FINDINGS: Brain: No evidence of acute territorial infarction, hemorrhage,
hydrocephalus,extra-axial collection or mass lesion/mass effect.
There is dilatation the ventricles and sulci consistent with
age-related atrophy. Low-attenuation changes in the deep white
matter consistent with small vessel ischemia.

Vascular: No hyperdense vessel or unexpected calcification.

Skull: The skull is intact. No fracture or focal lesion identified.

Sinuses/Orbits: The visualized paranasal sinuses and mastoid air
cells are clear. The orbits and globes intact.

Other: None

Cervical spine:

Alignment: There is straightening of the normal cervical lordosis.
There is a minimal anterolisthesis of C3 on C4 measuring 2 mm.

Skull base and vertebrae: Visualized skull base is intact. No
atlanto-occipital dissociation. The vertebral body heights are well
maintained. No fracture or pathologic osseous lesion seen.

Soft tissues and spinal canal: The visualized paraspinal soft
tissues are unremarkable. No prevertebral soft tissue swelling is
seen. The spinal canal is grossly unremarkable, no large epidural
collection or significant canal narrowing.

Disc levels: Multilevel cervical spine spondylosis is noted with
disc osteophyte complex and uncovertebral osteophytes most notable
at C3-C4 and C4-C5 with severe neural foraminal narrowing and
central canal stenosis.

Upper chest: The lung apices are clear. Thoracic inlet is within
normal limits.

Other: None
IMPRESSION: No acute intracranial abnormality.

Findings consistent with age related atrophy and chronic small
vessel ischemia

No acute fracture or malalignment of the spine.

## 2022-05-03 IMAGING — CT CT CERVICAL SPINE W/O CM
3 series · 15 of 27 positions shown, 18 images · non-contrast
Comparison: None.

CLINICAL DATA: Fall backward in wheelchair

EXAM:
CT HEAD WITHOUT CONTRAST
TECHNIQUE: Contiguous axial images were obtained from the base of the skull
through the vertex without intravenous contrast.

[Series 9: c spine soft · axial · 0.38mm/px · z∈[+1114,+1228]mm · 5 of 87 slices shown]
[im 15/87  soft-tissue]
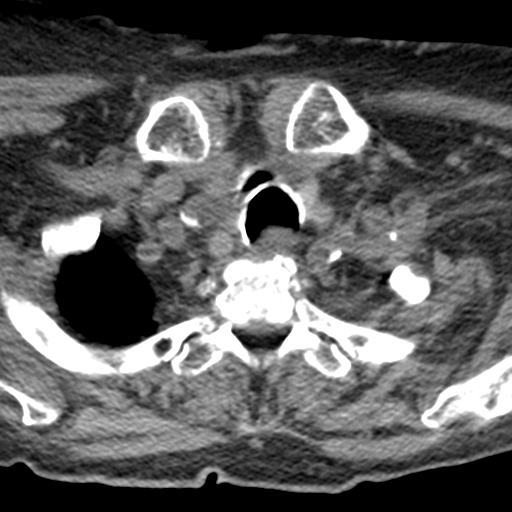
[im 29/87  soft-tissue]
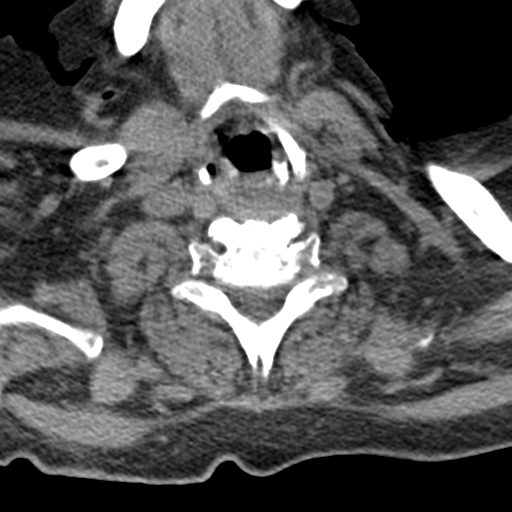
[im 44/87  soft-tissue]
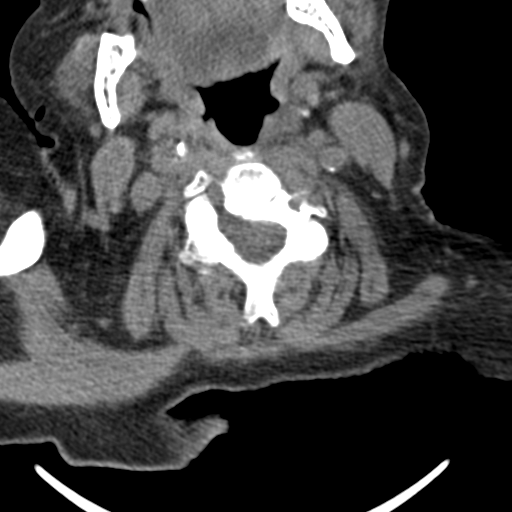
[im 58/87  soft-tissue]
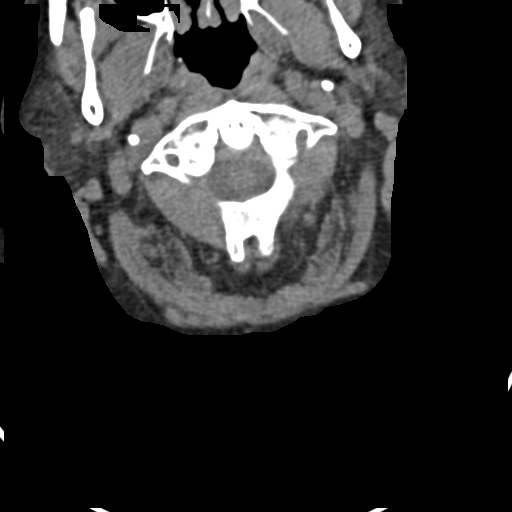
[im 72/87  soft-tissue]
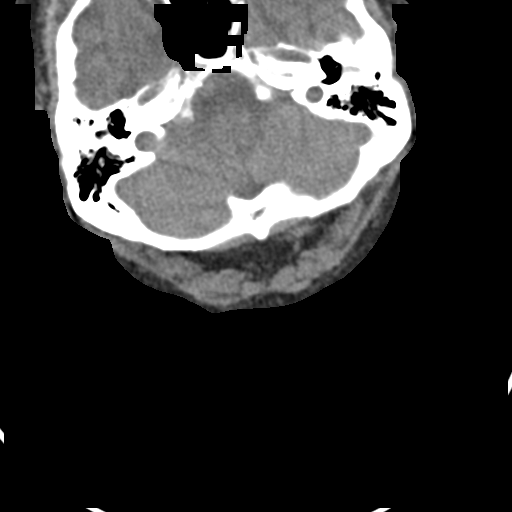

[Series 11: orthogonal bone · axial · 0.23mm/px · z∈[+1088,+1197]mm · 5 of 97 slices shown, 7 images]
[im 17/97  soft-tissue]
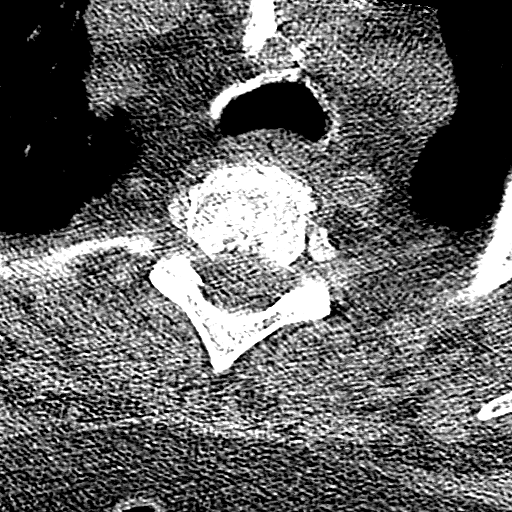
[im 17/97  bone]
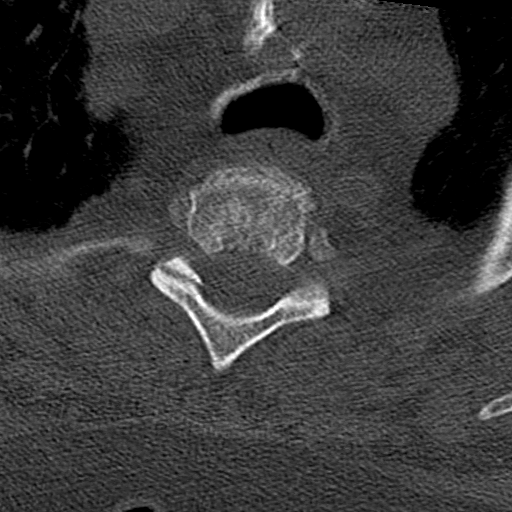
[im 33/97  bone]
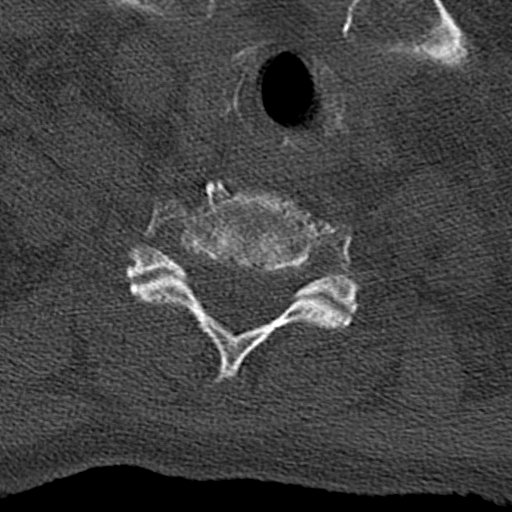
[im 49/97  bone]
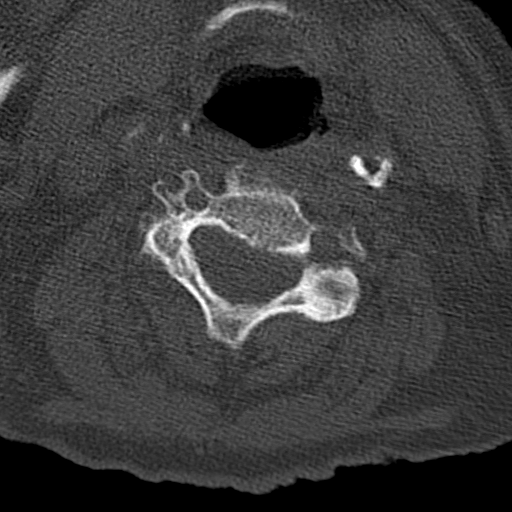
[im 65/97  bone]
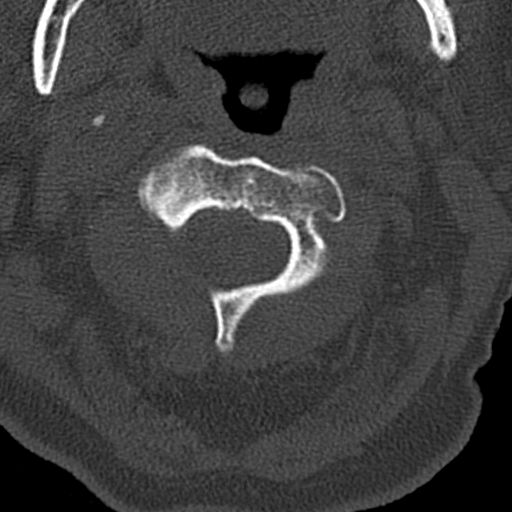
[im 81/97  soft-tissue]
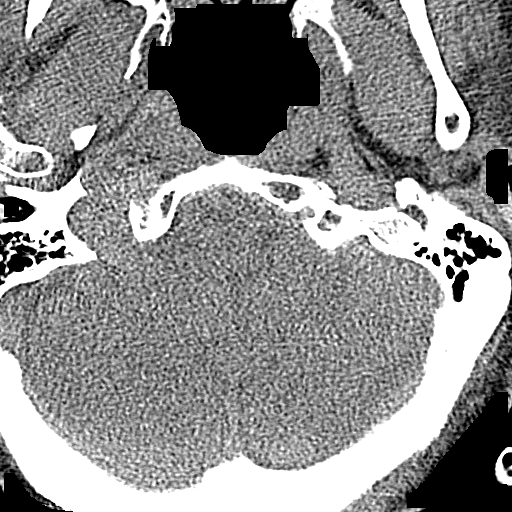
[im 81/97  bone]
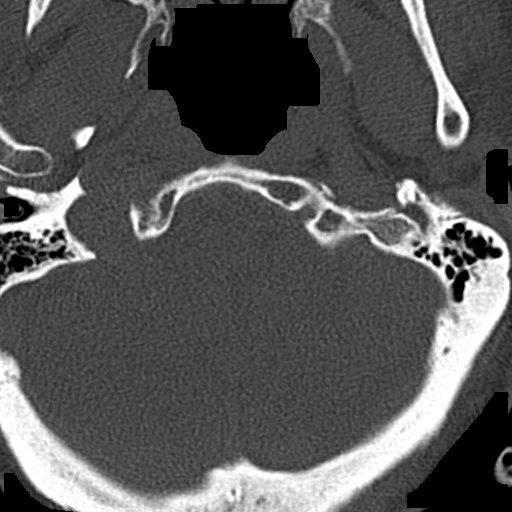

[Series 13: sagittal bone · sagittal · 0.36mm/px · 5 of 61 slices shown, 6 images]
[im 21/61  bone]
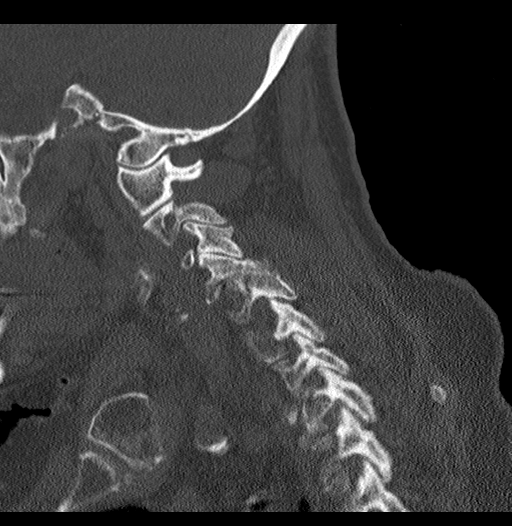
[im 26/61  bone]
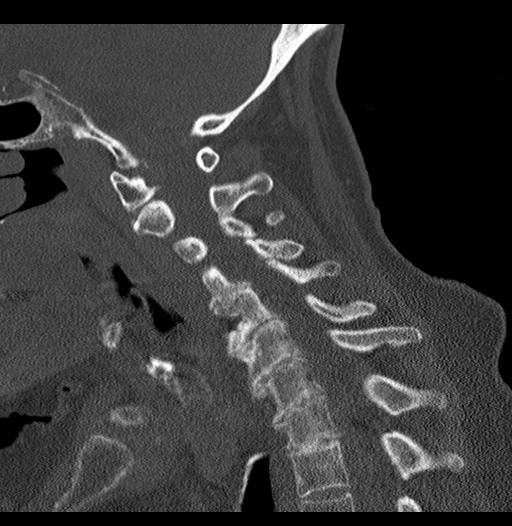
[im 31/61  soft-tissue]
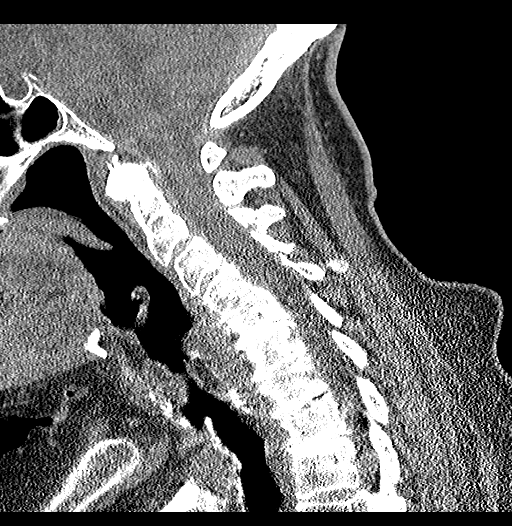
[im 31/61  bone]
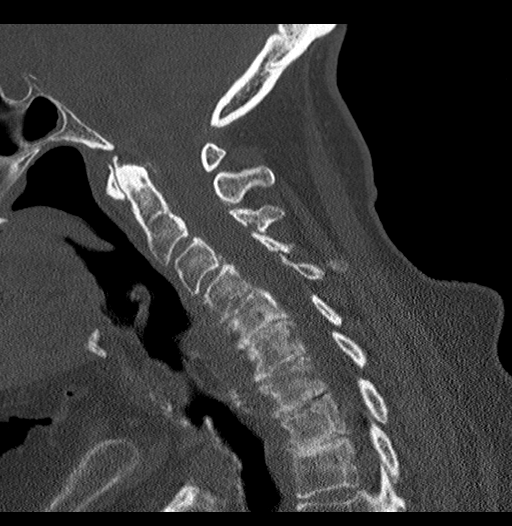
[im 36/61  bone]
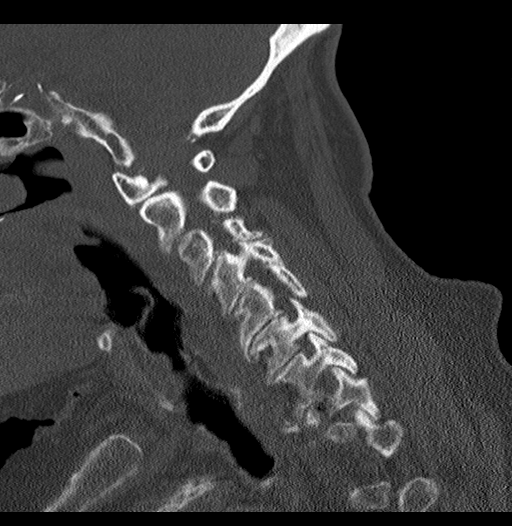
[im 41/61  bone]
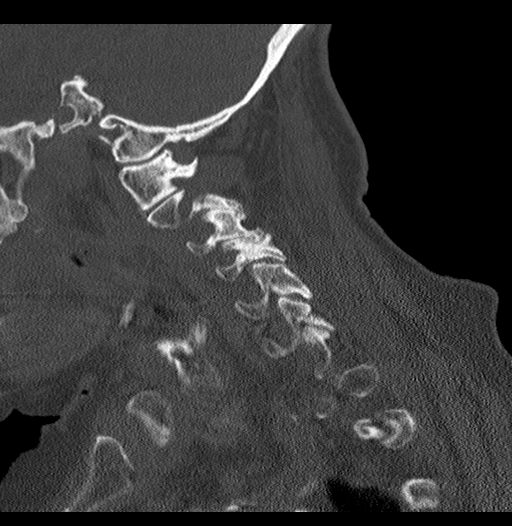

[15 of 27 positions shown; findings below may reference images not displayed]

FINDINGS: Brain: No evidence of acute territorial infarction, hemorrhage,
hydrocephalus,extra-axial collection or mass lesion/mass effect.
There is dilatation the ventricles and sulci consistent with
age-related atrophy. Low-attenuation changes in the deep white
matter consistent with small vessel ischemia.

Vascular: No hyperdense vessel or unexpected calcification.

Skull: The skull is intact. No fracture or focal lesion identified.

Sinuses/Orbits: The visualized paranasal sinuses and mastoid air
cells are clear. The orbits and globes intact.

Other: None

Cervical spine:

Alignment: There is straightening of the normal cervical lordosis.
There is a minimal anterolisthesis of C3 on C4 measuring 2 mm.

Skull base and vertebrae: Visualized skull base is intact. No
atlanto-occipital dissociation. The vertebral body heights are well
maintained. No fracture or pathologic osseous lesion seen.

Soft tissues and spinal canal: The visualized paraspinal soft
tissues are unremarkable. No prevertebral soft tissue swelling is
seen. The spinal canal is grossly unremarkable, no large epidural
collection or significant canal narrowing.

Disc levels: Multilevel cervical spine spondylosis is noted with
disc osteophyte complex and uncovertebral osteophytes most notable
at C3-C4 and C4-C5 with severe neural foraminal narrowing and
central canal stenosis.

Upper chest: The lung apices are clear. Thoracic inlet is within
normal limits.

Other: None
IMPRESSION: No acute intracranial abnormality.

Findings consistent with age related atrophy and chronic small
vessel ischemia

No acute fracture or malalignment of the spine.
